# Patient Record
Sex: Female | Born: 1935 | Race: White | Hispanic: No | Smoking: Former smoker
Health system: Southern US, Community
[De-identification: ages and names within clinical notes are randomized; demographics above are authoritative.]

## PROBLEM LIST (undated history)

## (undated) DIAGNOSIS — I639 Cerebral infarction, unspecified: Secondary | ICD-10-CM

## (undated) DIAGNOSIS — C50912 Malignant neoplasm of unspecified site of left female breast: Secondary | ICD-10-CM

## (undated) DIAGNOSIS — I6521 Occlusion and stenosis of right carotid artery: Secondary | ICD-10-CM

## (undated) DIAGNOSIS — Z9289 Personal history of other medical treatment: Secondary | ICD-10-CM

## (undated) DIAGNOSIS — E7800 Pure hypercholesterolemia, unspecified: Secondary | ICD-10-CM

## (undated) DIAGNOSIS — J189 Pneumonia, unspecified organism: Secondary | ICD-10-CM

## (undated) DIAGNOSIS — G459 Transient cerebral ischemic attack, unspecified: Secondary | ICD-10-CM

## (undated) DIAGNOSIS — I10 Essential (primary) hypertension: Secondary | ICD-10-CM

## (undated) DIAGNOSIS — M1990 Unspecified osteoarthritis, unspecified site: Secondary | ICD-10-CM

## (undated) HISTORY — PX: JOINT REPLACEMENT: SHX530

## (undated) HISTORY — PX: BREAST BIOPSY: SHX20

## (undated) HISTORY — PX: TOENAIL EXCISION: SUR558

## (undated) HISTORY — PX: TOTAL SHOULDER REPLACEMENT: SUR1217

## (undated) HISTORY — DX: Cerebral infarction, unspecified: I63.9

## (undated) HISTORY — PX: OTHER SURGICAL HISTORY: SHX169

## (undated) HISTORY — DX: Essential (primary) hypertension: I10

## (undated) HISTORY — PX: CATARACT EXTRACTION W/ INTRAOCULAR LENS  IMPLANT, BILATERAL: SHX1307

## (undated) HISTORY — DX: Unspecified osteoarthritis, unspecified site: M19.90

## (undated) HISTORY — PX: DILATION AND CURETTAGE OF UTERUS: SHX78

---
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
## 1995-09-12 HISTORY — PX: THUMB FUSION: SUR636

## 1998-09-11 DIAGNOSIS — C50912 Malignant neoplasm of unspecified site of left female breast: Secondary | ICD-10-CM

## 1998-09-11 HISTORY — DX: Malignant neoplasm of unspecified site of left female breast: C50.912

## 1999-05-13 HISTORY — PX: MASTECTOMY, PARTIAL: SHX709

## 2002-03-17 ENCOUNTER — Encounter: Admission: RE | Admit: 2002-03-17 | Discharge: 2002-03-17 | Payer: Self-pay | Admitting: General Surgery

## 2002-03-17 ENCOUNTER — Encounter: Payer: Self-pay | Admitting: General Surgery

## 2003-03-23 ENCOUNTER — Encounter: Admission: RE | Admit: 2003-03-23 | Discharge: 2003-03-23 | Payer: Self-pay | Admitting: General Surgery

## 2003-03-23 ENCOUNTER — Encounter: Payer: Self-pay | Admitting: General Surgery

## 2004-03-25 ENCOUNTER — Encounter: Admission: RE | Admit: 2004-03-25 | Discharge: 2004-03-25 | Payer: Self-pay | Admitting: General Surgery

## 2005-04-03 ENCOUNTER — Encounter: Admission: RE | Admit: 2005-04-03 | Discharge: 2005-04-03 | Payer: Self-pay | Admitting: General Surgery

## 2005-04-13 ENCOUNTER — Encounter: Admission: RE | Admit: 2005-04-13 | Discharge: 2005-04-13 | Payer: Self-pay | Admitting: General Surgery

## 2006-04-16 ENCOUNTER — Encounter: Admission: RE | Admit: 2006-04-16 | Discharge: 2006-04-16 | Payer: Self-pay | Admitting: General Surgery

## 2006-10-01 ENCOUNTER — Encounter: Admission: RE | Admit: 2006-10-01 | Discharge: 2006-10-01 | Payer: Self-pay | Admitting: Internal Medicine

## 2007-01-10 ENCOUNTER — Ambulatory Visit: Payer: Self-pay | Admitting: *Deleted

## 2007-01-24 ENCOUNTER — Encounter: Admission: RE | Admit: 2007-01-24 | Discharge: 2007-01-24 | Payer: Self-pay | Admitting: Orthopedic Surgery

## 2007-02-22 ENCOUNTER — Inpatient Hospital Stay (HOSPITAL_COMMUNITY): Admission: RE | Admit: 2007-02-22 | Discharge: 2007-02-24 | Payer: Self-pay | Admitting: Orthopedic Surgery

## 2007-05-24 ENCOUNTER — Encounter: Admission: RE | Admit: 2007-05-24 | Discharge: 2007-05-24 | Payer: Self-pay | Admitting: Internal Medicine

## 2008-01-03 ENCOUNTER — Ambulatory Visit: Payer: Self-pay | Admitting: Vascular Surgery

## 2008-03-26 ENCOUNTER — Encounter: Admission: RE | Admit: 2008-03-26 | Discharge: 2008-03-26 | Payer: Self-pay | Admitting: Orthopedic Surgery

## 2008-03-30 ENCOUNTER — Observation Stay (HOSPITAL_COMMUNITY): Admission: RE | Admit: 2008-03-30 | Discharge: 2008-03-31 | Payer: Self-pay | Admitting: Orthopedic Surgery

## 2008-06-16 ENCOUNTER — Encounter: Admission: RE | Admit: 2008-06-16 | Discharge: 2008-06-16 | Payer: Self-pay | Admitting: General Surgery

## 2009-04-05 ENCOUNTER — Ambulatory Visit: Payer: Self-pay | Admitting: Vascular Surgery

## 2009-06-04 IMAGING — CR DG SHOULDER 1V*L*
1 series · 1 of 1 positions shown · non-contrast
Comparison: 10/01/2006

CLINICAL DATA: Is post left shoulder arthroplasty

PORTABLE LEFT SHOULDER - 2+ VIEW

[view not recorded]
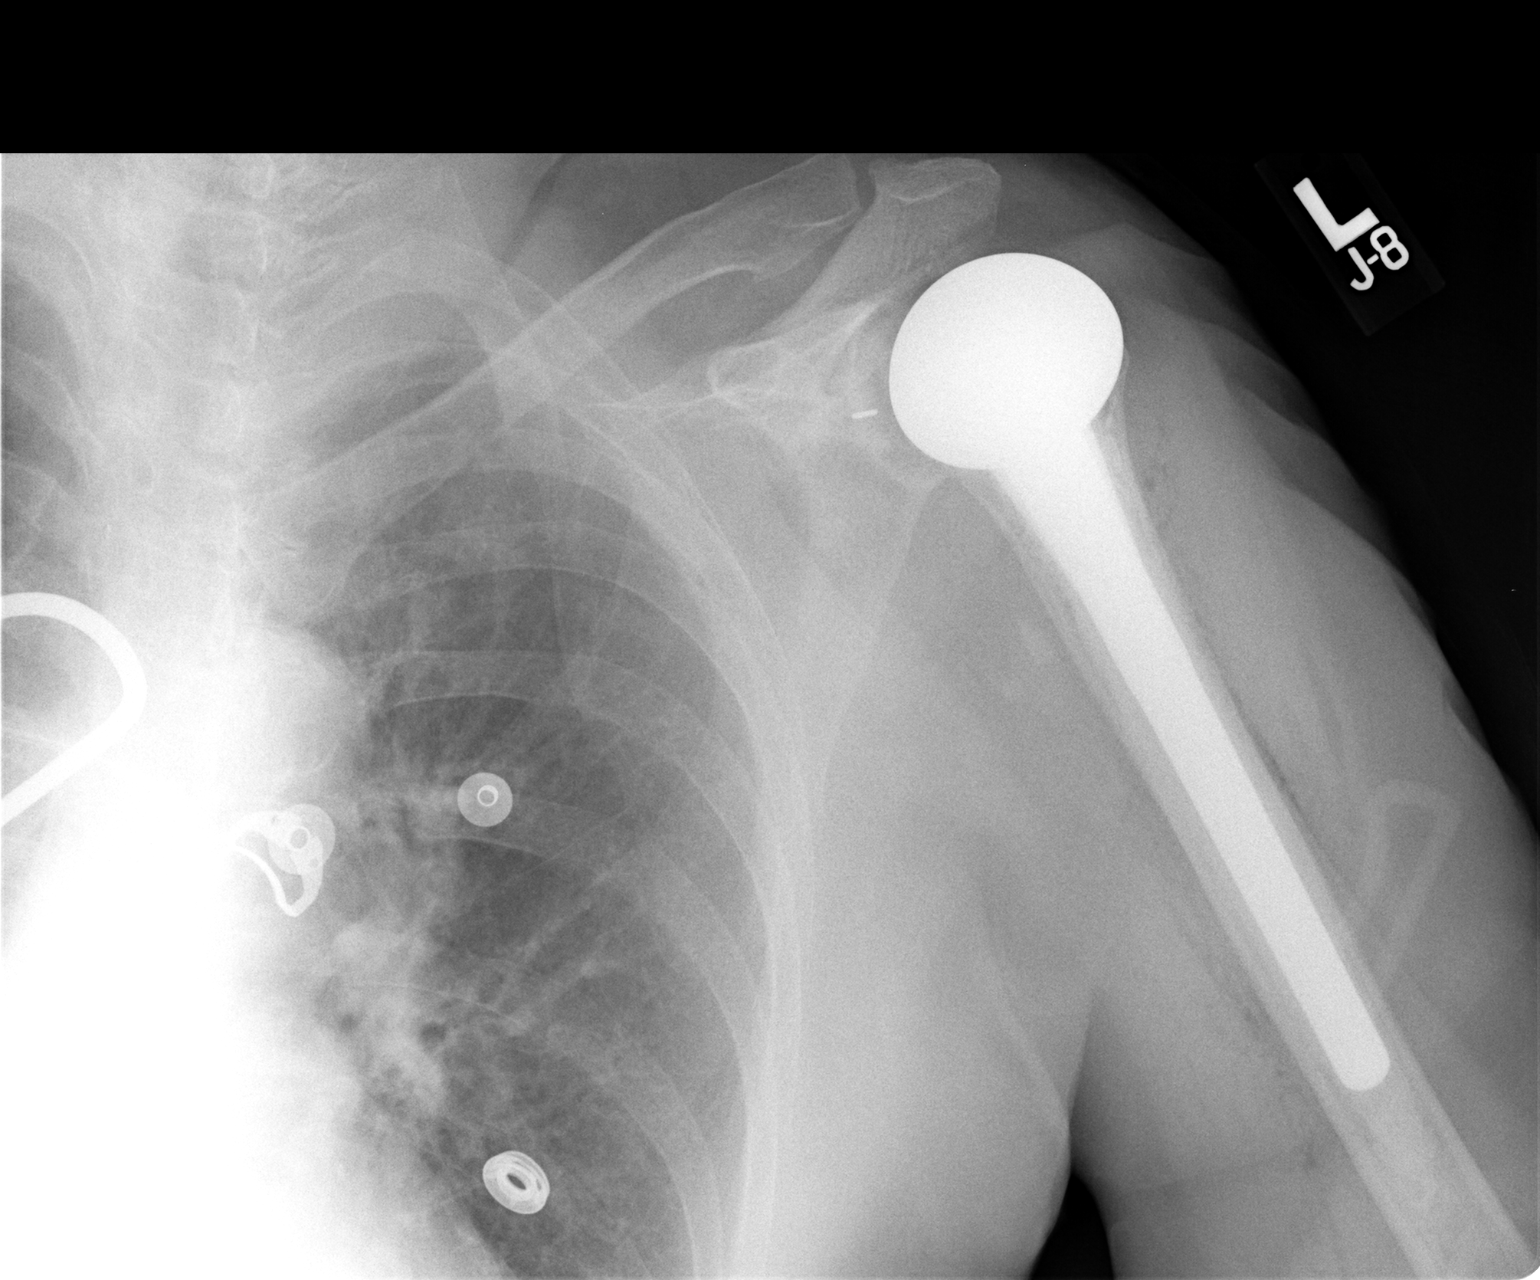

[1 of 1 positions shown; findings below may reference images not displayed]

FINDINGS: Status post left shoulder hemiarthroplasty with a
prosthetic left humeral head.  Good position and alignment in the
AP projection.
IMPRESSION: Good position alignment in the AP projection, following left
shoulder hemiarthroplasty.

## 2009-06-22 ENCOUNTER — Encounter: Admission: RE | Admit: 2009-06-22 | Discharge: 2009-06-22 | Payer: Self-pay | Admitting: General Surgery

## 2009-07-19 ENCOUNTER — Inpatient Hospital Stay (HOSPITAL_COMMUNITY): Admission: RE | Admit: 2009-07-19 | Discharge: 2009-07-22 | Payer: Self-pay | Admitting: Orthopedic Surgery

## 2009-07-19 HISTORY — PX: TOTAL KNEE ARTHROPLASTY: SHX125

## 2009-11-15 HISTORY — PX: EYE SURGERY: SHX253

## 2010-01-28 ENCOUNTER — Ambulatory Visit: Payer: Self-pay | Admitting: Vascular Surgery

## 2010-02-09 HISTORY — PX: TOTAL KNEE ARTHROPLASTY: SHX125

## 2010-02-21 ENCOUNTER — Inpatient Hospital Stay (HOSPITAL_COMMUNITY): Admission: RE | Admit: 2010-02-21 | Discharge: 2010-02-24 | Payer: Self-pay | Admitting: Orthopedic Surgery

## 2010-06-23 ENCOUNTER — Encounter: Admission: RE | Admit: 2010-06-23 | Discharge: 2010-06-23 | Payer: Self-pay | Admitting: General Surgery

## 2010-11-27 LAB — CBC
HCT: 29.1 % — ABNORMAL LOW (ref 36.0–46.0)
Hemoglobin: 10.1 g/dL — ABNORMAL LOW (ref 12.0–15.0)
MCHC: 32.9 g/dL (ref 30.0–36.0)
MCHC: 34.7 g/dL (ref 30.0–36.0)
MCV: 87.3 fL (ref 78.0–100.0)
RBC: 3.33 MIL/uL — ABNORMAL LOW (ref 3.87–5.11)
RDW: 12.7 % (ref 11.5–15.5)
RDW: 13.4 % (ref 11.5–15.5)
WBC: 6.9 10*3/uL (ref 4.0–10.5)
WBC: 8.2 10*3/uL (ref 4.0–10.5)

## 2010-11-27 LAB — BASIC METABOLIC PANEL
BUN: 14 mg/dL (ref 6–23)
CO2: 28 mEq/L (ref 19–32)
Calcium: 7.9 mg/dL — ABNORMAL LOW (ref 8.4–10.5)
Calcium: 8.1 mg/dL — ABNORMAL LOW (ref 8.4–10.5)
Chloride: 101 mEq/L (ref 96–112)
Creatinine, Ser: 0.56 mg/dL (ref 0.4–1.2)
GFR calc Af Amer: >60 mL/min (ref >60)
Sodium: 134 mEq/L — ABNORMAL LOW (ref 135–145)

## 2010-11-27 LAB — PROTIME-INR: Prothrombin Time: 20.5 seconds — ABNORMAL HIGH (ref 11.6–15.2)

## 2010-11-28 LAB — URINALYSIS, ROUTINE W REFLEX MICROSCOPIC
Bilirubin Urine: NEGATIVE
Hgb urine dipstick: NEGATIVE
Ketones, ur: NEGATIVE mg/dL
Protein, ur: NEGATIVE mg/dL
pH: 6.5 (ref 5.0–8.0)

## 2010-11-28 LAB — SURGICAL PCR SCREEN
MRSA, PCR: NEGATIVE
Staphylococcus aureus: NEGATIVE

## 2010-11-28 LAB — CBC
HCT: 40.5 % (ref 36.0–46.0)
MCHC: 33.7 g/dL (ref 30.0–36.0)
MCHC: 34.1 g/dL (ref 30.0–36.0)
MCV: 89.2 fL (ref 78.0–100.0)
MCV: 90.5 fL (ref 78.0–100.0)
Platelets: 155 10*3/uL (ref 150–400)
RBC: 3.09 MIL/uL — ABNORMAL LOW (ref 3.87–5.11)
RDW: 13.9 % (ref 11.5–15.5)
WBC: 4.6 10*3/uL (ref 4.0–10.5)
WBC: 9.4 10*3/uL (ref 4.0–10.5)

## 2010-11-28 LAB — TYPE AND SCREEN
ABO/RH(D): O POS
Antibody Screen: NEGATIVE

## 2010-11-28 LAB — COMPREHENSIVE METABOLIC PANEL
Chloride: 102 mEq/L (ref 96–112)
GFR calc Af Amer: >60 mL/min (ref >60)
Sodium: 138 mEq/L (ref 135–145)
Total Protein: 6.9 g/dL (ref 6.0–8.3)

## 2010-11-28 LAB — PROTIME-INR
INR: 0.94 (ref 0.00–1.49)
INR: 1.12 (ref 0.00–1.49)
Prothrombin Time: 14.3 seconds (ref 11.6–15.2)

## 2010-11-28 LAB — BASIC METABOLIC PANEL
BUN: 13 mg/dL (ref 6–23)
Calcium: 7.9 mg/dL — ABNORMAL LOW (ref 8.4–10.5)
Creatinine, Ser: 0.5 mg/dL (ref 0.4–1.2)
GFR calc Af Amer: >60 mL/min (ref >60)

## 2010-12-14 LAB — TYPE AND SCREEN
ABO/RH(D): O POS
Antibody Screen: NEGATIVE

## 2010-12-14 LAB — PROTIME-INR
INR: 0.97 (ref 0.00–1.49)
Prothrombin Time: 12.8 seconds (ref 11.6–15.2)
Prothrombin Time: 15.1 seconds (ref 11.6–15.2)
Prothrombin Time: 16.7 seconds — ABNORMAL HIGH (ref 11.6–15.2)

## 2010-12-14 LAB — BASIC METABOLIC PANEL
BUN: 18 mg/dL (ref 6–23)
Calcium: 7.9 mg/dL — ABNORMAL LOW (ref 8.4–10.5)
Creatinine, Ser: 0.51 mg/dL (ref 0.4–1.2)
GFR calc non Af Amer: >60 mL/min (ref >60)
Glucose, Bld: 158 mg/dL — ABNORMAL HIGH (ref 70–99)
Potassium: 3.9 mEq/L (ref 3.5–5.1)

## 2010-12-14 LAB — CBC
HCT: 25.4 % — ABNORMAL LOW (ref 36.0–46.0)
Hemoglobin: 10.9 g/dL — ABNORMAL LOW (ref 12.0–15.0)
MCHC: 34 g/dL (ref 30.0–36.0)
MCV: 90.2 fL (ref 78.0–100.0)
Platelets: 119 10*3/uL — ABNORMAL LOW (ref 150–400)
RBC: 3.37 MIL/uL — ABNORMAL LOW (ref 3.87–5.11)
RBC: 3.54 MIL/uL — ABNORMAL LOW (ref 3.87–5.11)
RDW: 13.4 % (ref 11.5–15.5)
RDW: 14.3 % (ref 11.5–15.5)
WBC: 4.7 10*3/uL (ref 4.0–10.5)
WBC: 4.9 10*3/uL (ref 4.0–10.5)

## 2010-12-14 LAB — COMPREHENSIVE METABOLIC PANEL
ALT: 30 U/L (ref 0–35)
AST: 40 U/L — ABNORMAL HIGH (ref 0–37)
Albumin: 3.6 g/dL (ref 3.5–5.2)
Alkaline Phosphatase: 86 U/L (ref 39–117)
BUN: 19 mg/dL (ref 6–23)
CO2: 27 mEq/L (ref 19–32)
CO2: 28 mEq/L (ref 19–32)
Calcium: 7.8 mg/dL — ABNORMAL LOW (ref 8.4–10.5)
Calcium: 8.7 mg/dL (ref 8.4–10.5)
Chloride: 101 mEq/L (ref 96–112)
Creatinine, Ser: 0.46 mg/dL (ref 0.4–1.2)
GFR calc Af Amer: >60 mL/min (ref >60)
GFR calc Af Amer: >60 mL/min (ref >60)
GFR calc non Af Amer: >60 mL/min (ref >60)
GFR calc non Af Amer: >60 mL/min (ref >60)
Glucose, Bld: 109 mg/dL — ABNORMAL HIGH (ref 70–99)
Glucose, Bld: 80 mg/dL (ref 70–99)
Potassium: 3.7 mEq/L (ref 3.5–5.1)
Potassium: 4.9 mEq/L (ref 3.5–5.1)
Sodium: 135 mEq/L (ref 135–145)
Sodium: 135 mEq/L (ref 135–145)
Total Bilirubin: 0.4 mg/dL (ref 0.3–1.2)
Total Protein: 6.3 g/dL (ref 6.0–8.3)

## 2010-12-14 LAB — CROSSMATCH

## 2010-12-14 LAB — PREPARE RBC (CROSSMATCH)

## 2010-12-14 LAB — ABO/RH: ABO/RH(D): O POS

## 2011-01-24 NOTE — Discharge Summary (Signed)
[name], [name]             ACCOUNT NO.:  [id number]   MEDICAL RECORD NO.:  [id number]          PATIENT TYPE:  INP   LOCATION:  5011                         FACILITY:  [facility]   PHYSICIAN:  Almedia Balls. Ranell Patrick, M.D. DATE OF BIRTH:  [birth year]   DATE OF ADMISSION:  03/30/2008  DATE OF DISCHARGE:  03/31/2008                               DISCHARGE SUMMARY   ADMISSION DIAGNOSIS:  Left shoulder pain secondary to end-stage  osteoarthritis.   DISCHARGE DIAGNOSIS:  Left shoulder pain secondary to end-stage  osteoarthritis, status post total shoulder arthroplasty.   BRIEF HISTORY:  The patient 77 year old female with worsening left  shoulder pain secondary to osteoarthritis.  The patient has elected to  have a left total shoulder arthroplasty by Dr. Malon Kindle.   PROCEDURE:  The patient had a left total shoulder arthroplasty by Dr.  Malon Kindle on March 30, 2008.  Assistant was [employer], P.A.-C.  General anesthesia was used with no complications.   HOSPITAL COURSE:  The patient admitted on March 30, 2008 for the above-  stated procedure which she tolerated well.  After adequate time in the  post-anesthesia care unit, she was transferred up to 5000.  On  postoperative day #1, the patient complained about minimal pain in left  shoulder, was actually rather happy pain was so less.  Neurovascularly,  she was intact.  Her dressing was intact with no signs of drainage.  Sling was intact.   DISCHARGE/PLAN:  The patient will be discharged home on March 31, 1008.  Her condition is stable.  Her diet is regular.  THE PATIENT HAS ALLERGY  TO VICODIN AND PERCOCET.   DISCHARGE MEDICATIONS:  1. Tenormin 25 mg p.o. every night.  2. Celebrex 200 mg p.o. b.i.d.  3. Lasix 20 mg p.o. daily.  4. Robaxin 500 mg p.o. q.6h.  5. Paroxetine 25 mg p.o. daily.  6. Darvocet 1-2 tablets q.4-6h. p.r.n. pain.   FOLLOW-UP:  Patient will follow back up with Dr. Malon Kindle in 1 to 2  weeks.     Thomas B. Durwin Nora, P.A.      Almedia Balls. Ranell Patrick, M.D.  Electronically Signed   TBD/MEDQ  D:  03/31/2008  T:  03/31/2008  Job:  [id number]

## 2011-01-24 NOTE — H&P (Signed)
[name], [name]             ACCOUNT NO.:  [id number]   MEDICAL RECORD NO.:  [id number]          PATIENT TYPE:  INP   LOCATION:  NA                           FACILITY:  [facility]   PHYSICIAN:  Almedia Balls. Ranell Patrick, M.D. DATE OF BIRTH:  06-19-1931   DATE OF ADMISSION:  DATE OF DISCHARGE:                              HISTORY & PHYSICAL   CHIEF COMPLAINT:  Right shoulder pain.   HISTORY OF PRESENT ILLNESS:  The patient is a 77 year old healthy-  appearing female complaining about worsening right shoulder pain.  The  patient is elected to have a right total shoulder arthroplasty by Dr.  Malon Kindle due to refractory symptoms with conservative treatment.   PAST MEDICAL HISTORY:  1. Hypertension.  2. Osteoarthritis.   The patient has no known allergies.   CURRENT MEDICATIONS:  1. Hydrochlorothiazide 12.5 mg p.o. daily.  2. Diltiazem 180/24 mg p.o. daily once.  3. Celebrex 200 mg 1 tab p.o. daily.   SOCIAL HISTORY:  The patient of Dr. Timothy Lasso.  She is married and denies  alcohol or smoking use.   REVIEW OF SYSTEMS:  Negative except for decreased range of motion and  pain with her right upper extremity and her shoulder.   PHYSICAL EXAM:  VITAL SIGNS:  Pulse 80, respirations 18, blood pressure  152/78.  The patient is a healthy-appearing 77 year old female in no  acute distress.  GENERAL:  Pleasant mood and affect, alert and oriented x3.  EXAMINATION OF THE HEAD AND NECK:  Shows no tenderness to palpation of  paraspinal muscles, spinous processes of the spinous processes of the  cervical spine.  She has full range of motion without any difficulty,  and cranial nerves II-XII are grossly intact.  EXAMINATION OF THE RIGHT SHOULDER:  Shows forward flexion to about 100  degrees, external rotation to 45 degrees, and internal rotation to L5.  Strength of internal and external rotation is 4.5/5. Capillary refill is  less than 2 seconds.  She does have mild crepitus with range of motion.  The left lower extremity shows full range of motion with sensation  grossly intact.  No gross deformity.  EXAMINATION OF THE CHEST:  Shows active breath sounds bilaterally.  No  wheezes, rhonchi, or rales.  HEART:  Shows Regular rate and rhythm, no murmur.  ABDOMEN:  Nontender, nondistended.  SKIN:  She has no rashes noted on exam.  EXTREMITIES:  No pedal edema.  She does have a normal gait.   X-rays show endstage osteoarthritis of the right shoulder.   IMPRESSION:  End stage osteoarthritis of the right shoulder.   PLAN:  She is to have a right total shoulder arthroplasty by Dr. Malon Kindle.      Thomas B. Durwin Nora, P.A.      Almedia Balls. Ranell Patrick, M.D.  Electronically Signed    TBD/MEDQ  D:  02/14/2007  T:  02/14/2007  Job:  [id number]

## 2011-01-24 NOTE — Op Note (Signed)
NAMENAILANI, FULL             ACCOUNT NO.:  [id number]   MEDICAL RECORD NO.:  [id number]          PATIENT TYPE:  INP   LOCATION:  5011                         FACILITY:  [facility]   PHYSICIAN:  Almedia Balls. Ranell Patrick, M.D. DATE OF BIRTH:  March 09, 1931   DATE OF PROCEDURE:  03/30/2008  DATE OF DISCHARGE:  03/31/2008                               OPERATIVE REPORT   PREOPERATIVE DIAGNOSES:  Left shoulder end-stage osteoarthritis.   POSTOPERATIVE DIAGNOSIS:  Left shoulder end-stage osteoarthritis.   PROCEDURE:  Left total shoulder replacement.   SURGEON:  Almedia Balls. Ranell Patrick, MD.   ASSISTANT:  Donnie Coffin. Dixon, PA   ANESTHESIA:  General anesthesia was used, plus interscalene block.   ESTIMATED BLOOD LOSS:  Less than was 100 mL.   FLUID REPLACEMENT:  1200 mL crystalloid.   INSTRUMENT COUNT:  Correct.   COMPLICATIONS:  None.   Preoperative antibiotics were given.   INDICATIONS:  The patient is a 77 year old female with a history of  worsening shoulder arthritis in the left.  She has had prior right total  shoulder replacement 4 months ago and has done well with that.  She  desires left shoulder replacement to restore function without any pain.  Informed consent was obtained.   DESCRIPTION OF PROCEDURE:  After an adequate level of anesthesia was  achieved,  the patient was positioned supine on the operating table.  She was brought up into the modified beach chair position.  The left  shoulder was sterilely prepped and draped in usual manner.  A  deltopectoral approach was utilized starting at the coracoid and  extending down to the anterior humeral shaft.  Dissection was carried  sharply down through the subcutaneous tissue.  Cephalic vein taken  lateral with deltoid.  Pectoralis taken medially.  The upper centimeter  pectoralis released.  The conjoined tendon taken medially.  Subscapularis taken off of less tuberosity and free depth from the  surrounding soft tissues.  Two #2 FiberWire  sutures placed in a modified  Mason-Allen suture technique into the free end of the subscap tendon.  The caps were released off the humerus.  The humeral osteotomy performed  using a neck-cut guide with the arm elbow at the side and about 10  degrees of external rotation giving [place] 20 degrees of retroverted cut.  We cut right at the insertion of the rotator cuff.  At this point,  removed all osteophytes off the humerus.  We then prepared the humerus  with sequential reaming between 12 and 14, thus we selected a 12 stem  and broached that and then we were planning on cementing the 12 stem.  We then retracted the humerus posteriorly and placed retractors around  the glenoid such that we could see at 360 degrees.  We marked the 12  o'clock, 6 o'clock, 3 and 9 o'clock positions to identify the center of  the glenoid.  We then went ahead and placed a centering guide and  drilled a hole for a 40 keeled glenoid.  We next went ahead and reamed  down to bleeding bone and then performed 12 o'clock and 6 o'clock  drill  holes, used a bur to take out remaining bone and then a keel punch to  make the hole for keel.  We trialed the 40 keeled glenoid component and  then went ahead and placed a 44 x 18 eccentric humeral head spinning the  eccentric portion towards the rotator cuff.  We had good coverage of the  proximal humerus.  Reduced the shoulder and felt like we had good soft  tissue balancing at this point.  Removed all trial components.  We  cemented the humeral component in first using the P #1 cement.  Once  that was allowed to harden, then we went ahead and checked for any  additional cement and we found none, and then cemented after using  Gelfoam soaked-thrombin to dry out the glenoid.  Cemented the glenoid  component into place.  Once that cement was allowed to harden, we  trialed again with the 18 and 21.  The 21 had a little bit better soft  tissue stability.  She was a little too loose with  the 18 and thus  replaced the 44 x 21 eccentric humeral head component on and impacted  that in place again with appropriate coverage, and then reduced the  shoulder, we were happy with her range of motion and balance and then  went and repaired her subscap with multiple number #2 FiberWire sutures.  We had placed some sutures in the shaft prior to placing her stem and  also used Mason-Allen sutures through good soft tissue including the  biceps out laterally.  We had a nice soft tissue repair including  oversewing the rotator interval with #2 FiberWire and a figure-of-eight,  and then went ahead and closed the deltopectoral interval after thorough  irrigation with 0 Vicryl suture followed by 2-0 Vicryl subcutaneous  closure and 4-0 Monocryl for skin.  Steri-Strips were applied followed  by a sterile dressing. The patient tolerated the surgery well.      Almedia Balls. Ranell Patrick, M.D.  Electronically Signed     SRN/MEDQ  D:  03/30/2008  T:  03/31/2008  Job:  [id number]

## 2011-01-24 NOTE — Procedures (Signed)
CAROTID DUPLEX EXAM   INDICATION:  Followup carotid artery disease.   HISTORY:  Diabetes:  No.  Cardiac:  No.  Hypertension:  Yes.  Smoking:  Quit in 1981.  Previous Surgery:  No.  CV History:  Amaurosis Fugax No, Paresthesias No, Hemiparesis No                                       RIGHT             LEFT  Brachial systolic pressure:         110  Brachial Doppler waveforms:         Biphasic  Vertebral direction of flow:        Antegrade         Antegrade  DUPLEX VELOCITIES (cm/sec)  CCA peak systolic                   88                74  ECA peak systolic                   110               64  ICA peak systolic                   147               82  ICA end diastolic                   43                26  PLAQUE MORPHOLOGY:                  Calcified         Mixed  PLAQUE AMOUNT:                      Moderate          Mild  PLAQUE LOCATION:                    ICA, ECA          ICA   IMPRESSION:  1. 40-59% right ICA stenosis by new criteria.  2. 20-39% left ICA stenosis.  3. Study essentially unchanged from 01/10/2007.  4. A preliminary copy was faxed to Dr. Ferd Hibbs office.   ___________________________________________  Larina Earthly, M.D.   DP/MEDQ  D:  01/03/2008  T:  01/03/2008  Job:  [id number]

## 2011-01-24 NOTE — H&P (Signed)
[name], [name]             ACCOUNT NO.:  [id number]   MEDICAL RECORD NO.:  [id number]          PATIENT TYPE:  INP   LOCATION:  NA                           FACILITY:  [facility]   PHYSICIAN:  Almedia Balls. Ranell Patrick, M.D. DATE OF BIRTH:  August 05, 1931   DATE OF ADMISSION:  DATE OF DISCHARGE:                              HISTORY & PHYSICAL   CHIEF COMPLAINT:  Left shoulder pain.   HISTORY OF PRESENT ILLNESS:  A 77 year old female with worsening left  shoulder pain refractory to conservative treatment.  The patient has  elected to have a left total shoulder arthroplasty by Dr. Malon Kindle.   PAST MEDICAL HISTORY:  Hypertension and osteoarthritis.   FAMILY HISTORY:  Coronary artery disease.   SOCIAL HISTORY:  Patient of Dr. Timothy Lasso.  Does not smoke or use alcohol.   ALLERGIES:  None.   CURRENT MEDICATIONS:  Celebrex 200 mg p.o. daily, Tylenol Arthritis q.4-  6 h. p.r.n. pain, atenolol 25 mg p.o. daily, and Lasix 20 mg p.o. daily.   REVIEW OF SYSTEMS:  Pain with range of motion in the left shoulder.  She  does have a previous right shoulder hemiarthroplasty with good range of  motion.   PHYSICAL EXAMINATION:  VITAL SIGNS:  Pulse 74, respirations 18, and  blood pressure 138/78.  GENERAL:  The patient is a healthy-appearing 77 year old female, in no  acute distress, pleasant, alert and oriented x3.  HEAD AND NECK:  Full range of motion.  Cranial nerves II-XII grossly  intact.  CHEST:  She has active breath sounds bilaterally.  No wheezes, rhonchi,  or rales.  HEART:  Regular rate and rhythm without murmur.  ABDOMEN:  Nontender and nondistended with active bowel sounds.  EXTREMITIES:  Moderate tenderness to the left shoulder with range of  motion and activity.  NEUROLOGICALLY:  She is intact with no rashes or edema in bilateral,  upper, and lower extremity.   X-rays show end-stage osteoarthritis to the left shoulder.   IMPRESSION:  End-stage osteoarthritis, left shoulder.   PLAN  OF ACTION:  To have a left total shoulder arthroplasty by Dr.  Malon Kindle.      Thomas B. Durwin Nora, P.A.      Almedia Balls. Ranell Patrick, M.D.  Electronically Signed    TBD/MEDQ  D:  03/25/2008  T:  03/26/2008  Job:  [id number]

## 2011-01-24 NOTE — Op Note (Signed)
[name], [name]             ACCOUNT NO.:  [id number]   MEDICAL RECORD NO.:  [id number]          PATIENT TYPE:  INP   LOCATION:  5023                         FACILITY:  [facility]   PHYSICIAN:  Almedia Balls. Ranell Patrick, M.D. DATE OF BIRTH:  11/27/1930   DATE OF PROCEDURE:  02/22/2007  DATE OF DISCHARGE:                               OPERATIVE REPORT   PREOPERATIVE DIAGNOSIS:  Right shoulder osteoarthritis, end stage.   POSTOPERATIVE DIAGNOSIS:  Right shoulder osteoarthritis, end stage.   PROCEDURE PERFORMED:  Right total shoulder replacement using DePuy  [employer] System with a keeled glenoid.   ATTENDING SURGEON:  Almedia Balls. Ranell Patrick, M.D.   ASSISTANT:  Donnie Coffin. Durwin Nora, P.A.   ANESTHESIA:  General anesthesia plus interscalene block anesthesia was  used.   ESTIMATED BLOOD LOSS:  Was 100 mL.   FLUID REPLACEMENT:  Was 250 mL albumin, 1500 mL crystalloid.   URINE OUTPUT:  550 mL.   INSTRUMENT COUNTS:  Correct.   COMPLICATIONS:  No complications.   ANTIBIOTICS:  Perioperative antibiotics were given.   INDICATIONS:  The patient is a 77 year old female with worsening  bilateral shoulder pain secondary to end-stage osteoarthritis.  Radiographs indicating bone-on-bone arthritis.  Patient continues to  have worsening pain despite conservative measures, including injections,  anti-inflammatories and therapy.  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  Patient presents now for operative  treatment, desiring a total shoulder arthroplasty to relieve pain and  restore function.  Informed consent was obtained.   DESCRIPTION OF PROCEDURE:  After an adequate level of anesthesia was  achieved, the patient was positioned in the modified beach chair  position.  All neurovascular structures were padded appropriately.  The  right shoulder was sterilely prepped and draped in the usual manner,  preoperative range of motion indicating forward elevation to about 140,  abduction  to 90, external rotation is about 60, internal rotation to her  posterior belt line.   After completion of the exam under anesthesia and a sterile prep and  drape, we went ahead and entered the shoulder using a deltopectoral  approach.  We started at the coracoid process, extending down to the  anterior humerus.  Dissection was carried sharply down through  subcutaneous tissues.  The cephalic vein was identified and taken  laterally over the deltoid.  The conjoined tendon was identified and  taken medially, exposing the subscapularis.  There was a large cystic  mass anterior to the biceps tendon.  This was removed sharply using  Bovie electrocautery.  This was intermittently involved with the biceps  and required sacrifice of the biceps tendon.  We tagged that for later  tenodesis.  The cystic structure was removed in its entirety.  It  appeared benign to me and reactive, such as related to osteoarthritis.  We did not send that to pathology due to its benign appearance and it  was filled with normal serous fluid.  This measured approximately 2 x 2  cm.   The subscapularis was divided about 1 cm medial to its insertion on the  left tuberosity.  We removed the anterior inferior  capsule.  We freed up  the subscapularis off the anterior scapular neck and also off the  coracoid process so we had a nice bounce.  We placed two #2 FiberWires  in a modified Mason-Allen suture technique into the free end of the  tendon for repair later.  Next, we went ahead and made our humeral cut  with the elbow at the patient's side and the forearm externally rotated  approximately 20 degrees.  We made an anterior to posterior cut right on  the articular margin with the rotator cuff anterior to posterior with an  oscillating saw.  We had a nice cut, sized the humeral head to a size  44.  We next removed osteophytes off the humeral neck and then went  ahead and prepared our humerus with sequential reaming up to  a size 14  and then broaching up to a 14 stem.  After we had our trial stem in  place, we went ahead and posteriorly subluxed the humerus and then  performed a 360-degree exposure on the glenoid.  There was a large  posterior osteophyte which was removed revealing a very, very small  glenoid.  This sized to be a size 40.  We went ahead and placed our  central hole to ream and drill that right dead center in the glenoid  with the surgeon's finger on the scapular neck to get angle of the  drilling.   Once we had this pilot hole drilled, we went ahead and reamed down to  cancellous bone.  We had gotten through the calcified cartilage and had  some bleeding in all quadrants.  Once we did this, we went ahead and  placed the anchor peg glenoid drill guide in place and noted that the  two inferior peg holes were dangerously close to the inferior cortical  border of the scapula and the glenoid and felt that these would both  perforate the inferior glenoid and likely compromise the bony support  for the glenoid component.  Thus we went ahead and fell back on a keeled  design and utilizing a 3 mm bur created a channel for the keel and then  punched with the keel punch, preparing for the glenoid component.  We  thoroughly irrigated the wound, placed our trial implants and the 40  keeled glenoid followed by a 44 x 18 head on top of our 14 stem and then  reduced the shoulder.  We had a nice tension with the ability to  translate posteriorly approximately 50% of the glenoid, also to  translate inferiorly about 50% of the glenoid and excellent coverage.  We went ahead then and retrieved our trial implants, thoroughly  irrigated and then went ahead and cemented the glenoid into place,  this  was a 40 glenoid with a keel, and cemented with DePuy HV Smart Set  Cement to allow that to fully harden in 15 minutes before removing excess cement with a curved osteotome.   We next went ahead and removed our  trial stem and then thoroughly  suctioned and irrigated that canal.  We used available bone graft from  the humeral head and impaction grafted the 14 body into the humerus.  We  had drilled previously three holes in the lesser tuberosity area for  placement of two #2 FiberWire sutures with a total of four strands  coming out through the lesser tuberosity for the repair of the  subscapularis.  Then we impacted the stem into place.  We selected a 44  eccentric head x 18 which gave [place] excellent coverage of the upper  humerus and put the implant right adjacent of the rotator cuff which  appeared to be in good shape.   Once that was done, we obtained some x-rays to demonstrate that we had  excellent positioning of our implants, which we did, reduced the  shoulder, had a nice soft tissue balance and then repaired our  subscapularis to the lesser tuberosity using the FiberWire sutures, the  Mason-Allens medially and through-the-bone sutures laterally.  We  oversewed the upper portion of the subscapularis up to the rotator  interval area in the leading edge of the supraspinatus and had a nice  repair there at the very upper portion of the subscapularis with two #2  FiberWire  sutures in a figure-of-eight fashion.  Thoroughly irrigated the  shoulder, closed the deltopectoral interval with 0 Vicryl suture  followed by 2-0 Vicryl subcutaneous closure and 4-0 Monocryl for skin.  Steri-Strips were applied followed by a sterile dressing and a shoulder  sling immobilizer.  The patient tolerated the surgery well.      Almedia Balls. Ranell Patrick, M.D.  Electronically Signed     SRN/MEDQ  D:  02/22/2007  T:  02/23/2007  Job:  [id number]

## 2011-01-24 NOTE — Procedures (Signed)
CAROTID DUPLEX EXAM   INDICATION:  Follow up carotid artery disease.   HISTORY:  Diabetes:  No.  Cardiac:  No.  Hypertension:  Yes.  Smoking:  Previous.  Previous Surgery:  No.  CV History:  No.  Amaurosis Fugax No, Paresthesias No, Hemiparesis No                                       RIGHT             LEFT  Brachial systolic pressure:                           Mastectomy  Brachial Doppler waveforms:         SCA-WNL           SCA-WNL  Vertebral direction of flow:        Antegrade         Antegrade  DUPLEX VELOCITIES (cm/sec)  CCA peak systolic                   81                92  ECA peak systolic                   141               98  ICA peak systolic                   149               101  ICA end diastolic                   39                30  PLAQUE MORPHOLOGY:                  Mixed             Mixed  PLAQUE AMOUNT:                      Moderate          Mild  PLAQUE LOCATION:                    ICA/ECA           ICA   IMPRESSION:  1. Right ICA shows evidence of 40% to 59% stenosis.  2. Left ICA shows evidence of 20% to 39% stenosis.  3. No significant changes from previous study.   ___________________________________________  Quita Skye Hart [place], M.D.   AS/MEDQ  D:  04/05/2009  T:  04/05/2009  Job:  [id number]

## 2011-01-24 NOTE — Procedures (Signed)
CAROTID DUPLEX EXAM   INDICATION:  Follow up known carotid disease.   HISTORY:  Diabetes:  No.  Cardiac:  No.  Hypertension:  Yes.  Smoking:  Quit in 1980.  Previous Surgery:  No.  CV History:  Asymptomatic.  Amaurosis Fugax No, Paresthesias No, Hemiparesis No.                                       RIGHT             LEFT  Brachial systolic pressure:                           Mastectomy  Brachial Doppler waveforms:         Normal            Normal  Vertebral direction of flow:        Antegrade         Antegrade  DUPLEX VELOCITIES (cm/sec)  CCA peak systolic                   84                88  ECA peak systolic                   139               81  ICA peak systolic                   212               102  ICA end diastolic                   63                33  PLAQUE MORPHOLOGY:                  Mixed             Mixed  PLAQUE AMOUNT:                      Moderate-to-severe                  Mild  PLAQUE LOCATION:                    ICA, ECA          ICA   IMPRESSION:  1. Doppler velocities suggest a low-end of 60% to 79% stenosis in the      right internal carotid artery.  2. Doppler velocities suggest a 1% to 39% stenosis in the left      internal carotid artery.  3. Bilateral antegrade flow in vertebral arteries.   ___________________________________________  Di Kindle. Edilia Bo, M.D.   NT/MEDQ  D:  01/28/2010  T:  01/28/2010  Job:  [id number]

## 2011-01-24 NOTE — H&P (Signed)
NAMEHINDY, [name]             ACCOUNT NO.:  [id number]   MEDICAL RECORD NO.:  [id number]          PATIENT TYPE:  INP   LOCATION:  NA                           FACILITY:  [facility]   PHYSICIAN:  Maisie Fus B. Dixon, P.A.  DATE OF BIRTH:  03/03/1931   DATE OF ADMISSION:  DATE OF DISCHARGE:                              HISTORY & PHYSICAL   CHIEF COMPLAINT:  Left shoulder pain.   HISTORY OF PRESENT ILLNESS:  The patient is a 77 year old female with  worsening left shoulder pain, which has been refractory to conservative  treatment.  The patient has elected to have a left total shoulder  arthroplasty by Dr. Malon Kindle.   PAST MEDICAL HISTORY:  Hypertension and osteoarthritis.   FAMILY MEDICAL HISTORY:  Coronary artery disease.   SOCIAL HISTORY:  The patient is married.  She does not smoke or use  alcohol.   DRUG ALLERGIES:  None.   CURRENT MEDICATIONS:  Celebrex 200 mg p.o. daily.   Dictation ended at this point.      Thomas B. Ferne Coe.     TBD/MEDQ  D:  03/25/2008  T:  03/26/2008  Job:  [id number]

## 2011-01-27 NOTE — Discharge Summary (Signed)
[name], [name]             ACCOUNT NO.:  [id number]   MEDICAL RECORD NO.:  [id number]          PATIENT TYPE:  INP   LOCATION:  5023                         FACILITY:  [facility]   PHYSICIAN:  Almedia Balls. Ranell Patrick, M.D. DATE OF BIRTH:  January 03, 1931   DATE OF ADMISSION:  02/22/2007  DATE OF DISCHARGE:  02/24/2007                               DISCHARGE SUMMARY   ADMISSION DIAGNOSES:  1. Right shoulder pain secondary to osteoarthritis.  2. History of hypertension.   DISCHARGE DIAGNOSES:  1. Right shoulder pain, status post a right total shoulder      arthroplasty.  2. History of hypertension.   BRIEF HISTORY:  The patient is a 77 year old female complaining of  worsening pain in the right shoulder.  The patient has elected to have a  right total shoulder arthroplasty by Dr. Malon Kindle.   PROCEDURE:  The patient had a right total shoulder arthroplasty by Dr.  Malon Kindle on February 22, 2007.  Assistant was [employer]. Dixon, PA-C.  Estimated blood loss was 100 mL.  No complications.   HOSPITAL COURSE:  The patient was admitted on February 22 2007, for the  above-stated procedure.  After adequate time in the post anesthesia care  unit she was transferred up to 5000.  Postop day #1 the patient  complained about only minimal pain and was able to work with physical  therapy and states her range of motion was quite good.  Her labs were  within normal limits.  She had no numbness, tingling, or paresthesias.  Postop day #2 she had a well-healing incision, no signs of erythema or  infection.  Dressing was changed.  She did complete physical therapy and  occupational therapy and plans were made to be discharged home.   FOLLOW-UP:  The patient to follow up with Dr. Malon Kindle in 2 weeks.   DISCHARGE PLAN:  The patient will be discharged home on February 24, 2007.   DISCHARGE MEDICATIONS:  1. Robaxin 500 mg one tablet q.6h p.r.n. spasm.  2. Hydrochlorothiazide 12.5 mg p.o. daily.  3. Diltiazem  180/24 mg p.o. daily.  4. Celebrex 200 mg one tablet p.o. daily.  5. Percocet one to two tablets q.4-6h. p.r.n. pain, dose is 5/325 mg.   The patient has no known drug allergies.   CONDITION:  Stable upon discharge.      Thomas B. Durwin Nora, P.A.      Almedia Balls. Ranell Patrick, M.D.  Electronically Signed    TBD/MEDQ  D:  03/05/2007  T:  03/05/2007  Job:  [id number]

## 2011-02-14 ENCOUNTER — Encounter (INDEPENDENT_AMBULATORY_CARE_PROVIDER_SITE_OTHER): Payer: Self-pay | Admitting: General Surgery

## 2011-06-09 LAB — URINALYSIS, ROUTINE W REFLEX MICROSCOPIC
Glucose, UA: NEGATIVE
Nitrite: NEGATIVE
Protein, ur: NEGATIVE
pH: 6.5

## 2011-06-09 LAB — HEMOGLOBIN AND HEMATOCRIT, BLOOD
HCT: 30.6 — ABNORMAL LOW
Hemoglobin: 10.1 — ABNORMAL LOW

## 2011-06-09 LAB — BASIC METABOLIC PANEL
BUN: 11
CO2: 30
Calcium: 8.3 — ABNORMAL LOW
Calcium: 9.7
Chloride: 99
Creatinine, Ser: 0.6
GFR calc Af Amer: >60
GFR calc non Af Amer: >60
Glucose, Bld: 90
Sodium: 137

## 2011-06-09 LAB — CBC
Hemoglobin: 14.2
MCHC: 33.9
RDW: 13.5

## 2011-06-09 LAB — DIFFERENTIAL
Basophils Absolute: 0
Basophils Relative: 1
Eosinophils Relative: 2
Monocytes Absolute: 0.4
Neutro Abs: 4

## 2011-06-09 LAB — URINE MICROSCOPIC-ADD ON

## 2011-06-09 LAB — URINE CULTURE

## 2011-06-09 LAB — TYPE AND SCREEN
ABO/RH(D): O POS
Antibody Screen: NEGATIVE

## 2011-06-09 LAB — APTT: aPTT: 32

## 2011-06-13 ENCOUNTER — Other Ambulatory Visit (INDEPENDENT_AMBULATORY_CARE_PROVIDER_SITE_OTHER): Payer: Self-pay | Admitting: General Surgery

## 2011-06-13 DIAGNOSIS — Z1231 Encounter for screening mammogram for malignant neoplasm of breast: Secondary | ICD-10-CM

## 2011-06-28 LAB — BASIC METABOLIC PANEL
BUN: 13
Calcium: 8.2 — ABNORMAL LOW
Creatinine, Ser: 0.49
GFR calc non Af Amer: >60
Glucose, Bld: 122 — ABNORMAL HIGH

## 2011-06-29 ENCOUNTER — Ambulatory Visit
Admission: RE | Admit: 2011-06-29 | Discharge: 2011-06-29 | Disposition: A | Discharge status: Home / Self Care | Payer: Medicare Other | Source: Ambulatory Visit | Attending: General Surgery | Admitting: General Surgery

## 2011-06-29 DIAGNOSIS — Z1231 Encounter for screening mammogram for malignant neoplasm of breast: Secondary | ICD-10-CM

## 2011-06-29 LAB — CBC
HCT: 31.3 — ABNORMAL LOW
Hemoglobin: 10.7 — ABNORMAL LOW
MCHC: 34.2
MCV: 87.9
Platelets: 170
Platelets: 249
RDW: 13.5
RDW: 13.7
WBC: 6.5

## 2011-06-29 LAB — PROTIME-INR: INR: 0.9

## 2011-06-29 LAB — URINALYSIS, ROUTINE W REFLEX MICROSCOPIC
Bilirubin Urine: NEGATIVE
Hgb urine dipstick: NEGATIVE
Nitrite: NEGATIVE
Specific Gravity, Urine: 1.016 (ref 1.005–1.035)
Urobilinogen, UA: 0.2
pH: 6.5

## 2011-06-29 LAB — BASIC METABOLIC PANEL
BUN: 13
CO2: 28
Chloride: 97
Glucose, Bld: 104 — ABNORMAL HIGH
Potassium: 3.7
Sodium: 130 — ABNORMAL LOW

## 2011-06-29 LAB — COMPREHENSIVE METABOLIC PANEL
AST: 20
Albumin: 3.6
Alkaline Phosphatase: 78
BUN: 23
Chloride: 94 — ABNORMAL LOW
GFR calc Af Amer: >60
Potassium: 4
Total Bilirubin: 0.6
Total Protein: 6.7

## 2011-06-29 LAB — APTT: aPTT: 32

## 2011-06-29 LAB — URINE MICROSCOPIC-ADD ON
RBC / HPF: NONE SEEN
WBC, UA: NONE SEEN

## 2011-07-20 ENCOUNTER — Emergency Department (HOSPITAL_COMMUNITY): Payer: Medicare Other

## 2011-07-20 ENCOUNTER — Encounter (HOSPITAL_COMMUNITY): Payer: Self-pay | Admitting: Emergency Medicine

## 2011-07-20 ENCOUNTER — Emergency Department (HOSPITAL_COMMUNITY)
Admission: EM | Admit: 2011-07-20 | Discharge: 2011-07-20 | Disposition: A | Discharge status: Home / Self Care | Payer: Medicare Other | Attending: Emergency Medicine | Admitting: Emergency Medicine

## 2011-07-20 DIAGNOSIS — S0100XA Unspecified open wound of scalp, initial encounter: Secondary | ICD-10-CM | POA: Insufficient documentation

## 2011-07-20 DIAGNOSIS — W010XXA Fall on same level from slipping, tripping and stumbling without subsequent striking against object, initial encounter: Secondary | ICD-10-CM | POA: Insufficient documentation

## 2011-07-20 DIAGNOSIS — S0101XA Laceration without foreign body of scalp, initial encounter: Secondary | ICD-10-CM

## 2011-07-20 DIAGNOSIS — I10 Essential (primary) hypertension: Secondary | ICD-10-CM | POA: Insufficient documentation

## 2011-07-20 DIAGNOSIS — S0990XA Unspecified injury of head, initial encounter: Secondary | ICD-10-CM | POA: Insufficient documentation

## 2011-07-20 NOTE — ED Notes (Addendum)
Pt arrived [facility] on [facility]SB, pt placed on continuous pulse oximetry and blood pressure cuff

## 2011-07-20 NOTE — ED Notes (Signed)
Pt stapled with 4 staples by MD. Pt tol well. Bleeding controlled. Pt's lac irrigated with sterile NS prior to staple repair.

## 2011-07-20 NOTE — ED Notes (Signed)
Per pt /EMS arrival. Pt was assisting her sister over curb. Pt's sister stumbled and fell causing patient to fall onto mulch from standing position. Pt's head hit metal post. Denies LOC.~ 2cc Lac to right temporal area. C collar and spinal board intact. Bleeding controlled with dsg by EMTs.

## 2011-07-20 NOTE — ED Notes (Signed)
Bright red drainage from lac; dsg changed and pressure held; bleeding controlled. Awaiting CT scan.

## 2011-07-20 NOTE — ED Provider Notes (Signed)
History     CSN: [id number] Arrival date & time: 07/20/2011 11:48 AM   First MD Initiated Contact with Patient 07/20/11 1203      Chief Complaint  Patient presents with  . Head Laceration    (Consider location/radiation/quality/duration/timing/severity/associated sxs/prior treatment) Patient is a 77 y.o. female presenting with scalp laceration. The history is provided by the patient.  Head Laceration Pertinent negatives include no chest pain, no abdominal pain and no headaches.   the patient is a 77 year old female with hypertension, who presents to the emergency department after she fell when she stumbled over some piles of dirt.  She fell backward onto her bottom, and then struck her head against a pole.  She denies loss of consciousness.  She denies vision changes.  She denies nausea, vomiting, or neck pain.  She thinks she had her last tetanus shot within the last 5 years.  She has no pain anywhere at this time.  She is not taking any anticoagulants.  Past Medical History  Diagnosis Date  . Hypertension   . Arthritis   . Blood transfusion   . Cancer     Past Surgical History  Procedure Date  . Total knee arthroplasty     LEFT  . Eye surgery 2011    CORRECT WRINKLED RETINA  . Breast surgery   . Mastectomy, partial 2000    left side  . Shoulder surgery     bilateral shoulder replacments  . Mastectomy, partial 2000    left    Family History  Problem Relation Age of Onset  . Cancer Sister   . Heart disease Sister     History  Substance Use Topics  . Smoking status: Former Smoker    Quit date: 07/20/1979  . Smokeless tobacco: Not on file  . Alcohol Use: No    OB History    Grav Para Term Preterm Abortions TAB SAB Ect Mult Living                  Review of Systems  HENT: Negative for nosebleeds and neck pain.   Eyes: Negative for visual disturbance.  Respiratory: Negative for chest tightness.   Cardiovascular: Negative for chest pain.    Gastrointestinal: Negative for abdominal pain.  Musculoskeletal: Negative for back pain.  Skin: Positive for wound.       Approximately 3 cm scalp/right parietal air  Neurological: Negative for dizziness and headaches.  Psychiatric/Behavioral: Negative for confusion.    Allergies  Review of patient's allergies indicates no known allergies.  Home Medications   Current Outpatient Rx  Name Route Sig Dispense Refill  . ATENOLOL PO Oral Take by mouth.      . CELEBREX PO Oral Take by mouth.      Marland Kitchen LASIX PO Oral Take by mouth.        BP 207/74  Pulse 73  Temp(Src) 98.7 F (37.1 C) (Oral)  Resp 16  Ht 5\' 3"  (1.6 m)  Wt 170 lb (77.111 kg)  BMI 30.11 kg/m2  SpO2 99%  Physical Exam  Constitutional: She is oriented to person, place, and time. She appears well-developed and well-nourished.  HENT:  Head: Normocephalic.       2 cm right scalp laceration, with no active bleeding.  Eyes: Conjunctivae are normal. Pupils are equal, round, and reactive to light.  Neck: Normal range of motion. Neck supple.       No neck tender  Cardiovascular: Normal rate, regular rhythm and normal heart sounds.  Pulmonary/Chest: Effort normal and breath sounds normal.  Abdominal: Soft. Bowel sounds are normal.  Musculoskeletal: Normal range of motion. She exhibits no tenderness.  Neurological: She is alert and oriented to person, place, and time.  Skin: Skin is warm.  Psychiatric: She has a normal mood and affect.    ED Course  Procedures (including critical care time)  77 year old female, who does not take anticoagulants.  Larey Seat, struck her head.  No loss of consciousness, nausea, vomiting vision changes, weakness, or slip or paresthesias.  She got approximately 2 cm laceration in the right scalp in the parietal area.  We will scan her head and repair her wound.  Labs Reviewed - No data to display No results found.   No diagnosis found.  LACERATION REPAIR Performed by: Nicholes Stairs Authorized by: Nicholes Stairs Consent: Verbal consent obtained. Risks and benefits: risks, benefits and alternatives were discussed Consent given by: patient Patient identity confirmed: provided demographic data Prepped and Draped in normal sterile fashion Wound explored  Laceration Location: right scalp  Laceration Length: 2cm  No Foreign Bodies seen or palpated  Anesthesia: local infiltration  Local anesthetic:none Anesthetic total: none Irrigation method: syringe Amount of cleaning: standard  Skin closure:staples Number of sutures:4 staples Technique: Staple   Patient tolerance: Patient tolerated the procedure well with no immediate complications.  MDM  Scalp laceration         Nicholes Stairs, MD 07/20/11 1525

## 2011-07-28 ENCOUNTER — Ambulatory Visit (INDEPENDENT_AMBULATORY_CARE_PROVIDER_SITE_OTHER): Payer: Medicare Other | Admitting: General Surgery

## 2011-07-28 DIAGNOSIS — Z4802 Encounter for removal of sutures: Secondary | ICD-10-CM

## 2011-07-28 NOTE — Progress Notes (Signed)
PT HERE TO HAVE STAPLES REMOVED FROM SCALP AFTER VISIT TO ER AND TREATMENT FOR LACERATION OF SCALP. 4 STAPLES REMOVED WITHOUT DIFFICULTY. INCISION CLOSED WITHOUT DRAINAGE.

## 2011-10-05 ENCOUNTER — Encounter (INDEPENDENT_AMBULATORY_CARE_PROVIDER_SITE_OTHER): Payer: Self-pay | Admitting: General Surgery

## 2011-10-10 DIAGNOSIS — H268 Other specified cataract: Secondary | ICD-10-CM | POA: Diagnosis not present

## 2011-10-10 DIAGNOSIS — H04129 Dry eye syndrome of unspecified lacrimal gland: Secondary | ICD-10-CM | POA: Diagnosis not present

## 2011-10-10 DIAGNOSIS — H40019 Open angle with borderline findings, low risk, unspecified eye: Secondary | ICD-10-CM | POA: Diagnosis not present

## 2011-10-10 DIAGNOSIS — H35379 Puckering of macula, unspecified eye: Secondary | ICD-10-CM | POA: Diagnosis not present

## 2011-11-09 ENCOUNTER — Encounter (INDEPENDENT_AMBULATORY_CARE_PROVIDER_SITE_OTHER): Payer: Self-pay | Admitting: General Surgery

## 2011-11-13 ENCOUNTER — Encounter (INDEPENDENT_AMBULATORY_CARE_PROVIDER_SITE_OTHER): Payer: Self-pay | Admitting: General Surgery

## 2011-11-13 ENCOUNTER — Ambulatory Visit (INDEPENDENT_AMBULATORY_CARE_PROVIDER_SITE_OTHER): Payer: Medicare Other | Admitting: General Surgery

## 2011-11-13 VITALS — BP 130/66 | HR 79 | Temp 97.0°F | Ht 63.0 in | Wt 170.6 lb

## 2011-11-13 DIAGNOSIS — Z853 Personal history of malignant neoplasm of breast: Secondary | ICD-10-CM

## 2011-11-13 NOTE — Patient Instructions (Signed)
Continue regular self exams  

## 2011-11-15 DIAGNOSIS — M19079 Primary osteoarthritis, unspecified ankle and foot: Secondary | ICD-10-CM | POA: Diagnosis not present

## 2011-11-15 DIAGNOSIS — A5211 Tabes dorsalis: Secondary | ICD-10-CM | POA: Diagnosis not present

## 2011-11-22 ENCOUNTER — Encounter (INDEPENDENT_AMBULATORY_CARE_PROVIDER_SITE_OTHER): Payer: Self-pay | Admitting: General Surgery

## 2011-11-22 NOTE — Progress Notes (Signed)
Subjective:     Patient ID: [name], female   DOB: Jan 22, 1931, 78 y.o.   MRN: [id number]  HPI The patient is a 78 year old white female who is 12 years out from a left breast lumpectomy and negative sentinel node biopsy for a T1 B. N0 left breast cancer. Her last mammogram was in October and showed no evidence of malignancy. During the last year she has been well. She has no complaints today.  Review of Systems  Constitutional: Negative.   HENT: Negative.   Eyes: Positive for visual disturbance.  Respiratory: Negative.   Cardiovascular: Negative.   Gastrointestinal: Negative.   Genitourinary: Negative.   Musculoskeletal: Positive for arthralgias.  Skin: Negative.   Neurological: Negative.   Hematological: Negative.   Psychiatric/Behavioral: Negative.        Objective:   Physical Exam  Constitutional: She is oriented to person, place, and time. She appears well-developed and well-nourished.  HENT:  Head: Normocephalic and atraumatic.  Eyes: Conjunctivae and EOM are normal. Pupils are equal, round, and reactive to light.  Neck: Normal range of motion. Neck supple.  Cardiovascular: Normal rate, regular rhythm and normal heart sounds.   Pulmonary/Chest: Effort normal and breath sounds normal.       No palpable mass in either breast. No axillary supraclavicular or cervical lymphadenopathy  Abdominal: Soft. Bowel sounds are normal. She exhibits no mass. There is no tenderness.  Musculoskeletal: Normal range of motion.  Lymphadenopathy:    She has no cervical adenopathy.  Neurological: She is alert and oriented to person, place, and time.  Skin: Skin is warm and dry.  Psychiatric: She has a normal mood and affect. Her behavior is normal.       Assessment:     12 years status post left breast lumpectomy and negative sentinel node biopsy    Plan:     At this point she will continue to do regular self exams. We will plan to see her back in one year.

## 2012-01-10 DIAGNOSIS — M79609 Pain in unspecified limb: Secondary | ICD-10-CM | POA: Diagnosis not present

## 2012-02-06 DIAGNOSIS — I6529 Occlusion and stenosis of unspecified carotid artery: Secondary | ICD-10-CM | POA: Diagnosis not present

## 2012-02-06 DIAGNOSIS — E785 Hyperlipidemia, unspecified: Secondary | ICD-10-CM | POA: Diagnosis not present

## 2012-02-06 DIAGNOSIS — G4700 Insomnia, unspecified: Secondary | ICD-10-CM | POA: Diagnosis not present

## 2012-02-06 DIAGNOSIS — I10 Essential (primary) hypertension: Secondary | ICD-10-CM | POA: Diagnosis not present

## 2012-02-27 DIAGNOSIS — I658 Occlusion and stenosis of other precerebral arteries: Secondary | ICD-10-CM | POA: Diagnosis not present

## 2012-02-27 DIAGNOSIS — I6529 Occlusion and stenosis of unspecified carotid artery: Secondary | ICD-10-CM | POA: Diagnosis not present

## 2012-03-13 DIAGNOSIS — M19079 Primary osteoarthritis, unspecified ankle and foot: Secondary | ICD-10-CM | POA: Diagnosis not present

## 2012-03-17 HISTORY — PX: ANKLE SURGERY: SHX546

## 2012-03-21 DIAGNOSIS — M19079 Primary osteoarthritis, unspecified ankle and foot: Secondary | ICD-10-CM | POA: Diagnosis not present

## 2012-03-21 DIAGNOSIS — I10 Essential (primary) hypertension: Secondary | ICD-10-CM | POA: Diagnosis not present

## 2012-03-22 DIAGNOSIS — G609 Hereditary and idiopathic neuropathy, unspecified: Secondary | ICD-10-CM | POA: Diagnosis not present

## 2012-03-22 DIAGNOSIS — Z853 Personal history of malignant neoplasm of breast: Secondary | ICD-10-CM | POA: Diagnosis not present

## 2012-03-22 DIAGNOSIS — R262 Difficulty in walking, not elsewhere classified: Secondary | ICD-10-CM | POA: Diagnosis not present

## 2012-03-22 DIAGNOSIS — I10 Essential (primary) hypertension: Secondary | ICD-10-CM | POA: Diagnosis not present

## 2012-03-22 DIAGNOSIS — Z79899 Other long term (current) drug therapy: Secondary | ICD-10-CM | POA: Diagnosis not present

## 2012-03-22 DIAGNOSIS — M19079 Primary osteoarthritis, unspecified ankle and foot: Secondary | ICD-10-CM | POA: Diagnosis not present

## 2012-03-22 DIAGNOSIS — Z7982 Long term (current) use of aspirin: Secondary | ICD-10-CM | POA: Diagnosis not present

## 2012-03-23 DIAGNOSIS — I10 Essential (primary) hypertension: Secondary | ICD-10-CM | POA: Diagnosis not present

## 2012-03-23 DIAGNOSIS — Z7982 Long term (current) use of aspirin: Secondary | ICD-10-CM | POA: Diagnosis not present

## 2012-03-23 DIAGNOSIS — Z79899 Other long term (current) drug therapy: Secondary | ICD-10-CM | POA: Diagnosis not present

## 2012-03-23 DIAGNOSIS — G8918 Other acute postprocedural pain: Secondary | ICD-10-CM | POA: Diagnosis not present

## 2012-03-23 DIAGNOSIS — M79609 Pain in unspecified limb: Secondary | ICD-10-CM | POA: Diagnosis not present

## 2012-03-23 DIAGNOSIS — M19079 Primary osteoarthritis, unspecified ankle and foot: Secondary | ICD-10-CM | POA: Diagnosis not present

## 2012-03-23 DIAGNOSIS — Z853 Personal history of malignant neoplasm of breast: Secondary | ICD-10-CM | POA: Diagnosis not present

## 2012-03-23 DIAGNOSIS — G609 Hereditary and idiopathic neuropathy, unspecified: Secondary | ICD-10-CM | POA: Diagnosis not present

## 2012-05-06 DIAGNOSIS — M25579 Pain in unspecified ankle and joints of unspecified foot: Secondary | ICD-10-CM | POA: Diagnosis not present

## 2012-05-15 DIAGNOSIS — H43399 Other vitreous opacities, unspecified eye: Secondary | ICD-10-CM | POA: Diagnosis not present

## 2012-05-15 DIAGNOSIS — H04129 Dry eye syndrome of unspecified lacrimal gland: Secondary | ICD-10-CM | POA: Diagnosis not present

## 2012-05-15 DIAGNOSIS — H27139 Posterior dislocation of lens, unspecified eye: Secondary | ICD-10-CM | POA: Diagnosis not present

## 2012-05-15 DIAGNOSIS — Z961 Presence of intraocular lens: Secondary | ICD-10-CM | POA: Diagnosis not present

## 2012-05-15 DIAGNOSIS — H40019 Open angle with borderline findings, low risk, unspecified eye: Secondary | ICD-10-CM | POA: Diagnosis not present

## 2012-07-03 DIAGNOSIS — H35379 Puckering of macula, unspecified eye: Secondary | ICD-10-CM | POA: Diagnosis not present

## 2012-07-03 DIAGNOSIS — H27139 Posterior dislocation of lens, unspecified eye: Secondary | ICD-10-CM | POA: Diagnosis not present

## 2012-07-03 DIAGNOSIS — M25579 Pain in unspecified ankle and joints of unspecified foot: Secondary | ICD-10-CM | POA: Diagnosis not present

## 2012-07-03 DIAGNOSIS — Z96669 Presence of unspecified artificial ankle joint: Secondary | ICD-10-CM | POA: Diagnosis not present

## 2012-07-03 DIAGNOSIS — H268 Other specified cataract: Secondary | ICD-10-CM | POA: Diagnosis not present

## 2012-07-03 DIAGNOSIS — Z961 Presence of intraocular lens: Secondary | ICD-10-CM | POA: Diagnosis not present

## 2012-07-11 DIAGNOSIS — H26419 Soemmering's ring, unspecified eye: Secondary | ICD-10-CM | POA: Diagnosis not present

## 2012-07-11 DIAGNOSIS — Z7982 Long term (current) use of aspirin: Secondary | ICD-10-CM | POA: Diagnosis not present

## 2012-07-11 DIAGNOSIS — Y832 Surgical operation with anastomosis, bypass or graft as the cause of abnormal reaction of the patient, or of later complication, without mention of misadventure at the time of the procedure: Secondary | ICD-10-CM | POA: Diagnosis not present

## 2012-07-11 DIAGNOSIS — H43319 Vitreous membranes and strands, unspecified eye: Secondary | ICD-10-CM | POA: Diagnosis not present

## 2012-07-11 DIAGNOSIS — I10 Essential (primary) hypertension: Secondary | ICD-10-CM | POA: Diagnosis not present

## 2012-07-11 DIAGNOSIS — T8529XA Other mechanical complication of intraocular lens, initial encounter: Secondary | ICD-10-CM | POA: Diagnosis not present

## 2012-07-11 DIAGNOSIS — Z79899 Other long term (current) drug therapy: Secondary | ICD-10-CM | POA: Diagnosis not present

## 2012-07-11 HISTORY — PX: EYE SURGERY: SHX253

## 2012-07-12 DIAGNOSIS — H27139 Posterior dislocation of lens, unspecified eye: Secondary | ICD-10-CM | POA: Diagnosis not present

## 2012-07-12 DIAGNOSIS — H268 Other specified cataract: Secondary | ICD-10-CM | POA: Diagnosis not present

## 2012-07-15 ENCOUNTER — Other Ambulatory Visit (INDEPENDENT_AMBULATORY_CARE_PROVIDER_SITE_OTHER): Payer: Self-pay | Admitting: General Surgery

## 2012-07-15 DIAGNOSIS — Z1231 Encounter for screening mammogram for malignant neoplasm of breast: Secondary | ICD-10-CM

## 2012-07-24 DIAGNOSIS — H35379 Puckering of macula, unspecified eye: Secondary | ICD-10-CM | POA: Diagnosis not present

## 2012-07-31 DIAGNOSIS — M1990 Unspecified osteoarthritis, unspecified site: Secondary | ICD-10-CM | POA: Diagnosis not present

## 2012-07-31 DIAGNOSIS — M19079 Primary osteoarthritis, unspecified ankle and foot: Secondary | ICD-10-CM | POA: Diagnosis not present

## 2012-07-31 DIAGNOSIS — M25579 Pain in unspecified ankle and joints of unspecified foot: Secondary | ICD-10-CM | POA: Diagnosis not present

## 2012-08-01 ENCOUNTER — Encounter: Payer: Self-pay | Admitting: Vascular Surgery

## 2012-08-05 DIAGNOSIS — E785 Hyperlipidemia, unspecified: Secondary | ICD-10-CM | POA: Diagnosis not present

## 2012-08-05 DIAGNOSIS — E559 Vitamin D deficiency, unspecified: Secondary | ICD-10-CM | POA: Diagnosis not present

## 2012-08-05 DIAGNOSIS — L089 Local infection of the skin and subcutaneous tissue, unspecified: Secondary | ICD-10-CM | POA: Diagnosis not present

## 2012-08-05 DIAGNOSIS — I10 Essential (primary) hypertension: Secondary | ICD-10-CM | POA: Diagnosis not present

## 2012-08-06 DIAGNOSIS — H04129 Dry eye syndrome of unspecified lacrimal gland: Secondary | ICD-10-CM | POA: Diagnosis not present

## 2012-08-12 DIAGNOSIS — Z1331 Encounter for screening for depression: Secondary | ICD-10-CM | POA: Diagnosis not present

## 2012-08-12 DIAGNOSIS — E785 Hyperlipidemia, unspecified: Secondary | ICD-10-CM | POA: Diagnosis not present

## 2012-08-12 DIAGNOSIS — Z23 Encounter for immunization: Secondary | ICD-10-CM | POA: Diagnosis not present

## 2012-08-12 DIAGNOSIS — Z0000 Encounter for general adult medical examination without abnormal findings: Secondary | ICD-10-CM | POA: Diagnosis not present

## 2012-08-12 DIAGNOSIS — G4700 Insomnia, unspecified: Secondary | ICD-10-CM | POA: Diagnosis not present

## 2012-08-15 DIAGNOSIS — Z1212 Encounter for screening for malignant neoplasm of rectum: Secondary | ICD-10-CM | POA: Diagnosis not present

## 2012-08-19 ENCOUNTER — Ambulatory Visit
Admission: RE | Admit: 2012-08-19 | Discharge: 2012-08-19 | Disposition: A | Discharge status: Home / Self Care | Payer: Medicare Other | Source: Ambulatory Visit | Attending: General Surgery | Admitting: General Surgery

## 2012-08-19 DIAGNOSIS — Z1231 Encounter for screening mammogram for malignant neoplasm of breast: Secondary | ICD-10-CM | POA: Diagnosis not present

## 2012-08-21 DIAGNOSIS — H268 Other specified cataract: Secondary | ICD-10-CM | POA: Diagnosis not present

## 2012-08-21 DIAGNOSIS — H35379 Puckering of macula, unspecified eye: Secondary | ICD-10-CM | POA: Diagnosis not present

## 2012-08-21 DIAGNOSIS — Z961 Presence of intraocular lens: Secondary | ICD-10-CM | POA: Diagnosis not present

## 2012-08-21 DIAGNOSIS — H1830 Unspecified corneal membrane change: Secondary | ICD-10-CM | POA: Diagnosis not present

## 2012-08-21 DIAGNOSIS — H27139 Posterior dislocation of lens, unspecified eye: Secondary | ICD-10-CM | POA: Diagnosis not present

## 2012-08-27 ENCOUNTER — Telehealth (INDEPENDENT_AMBULATORY_CARE_PROVIDER_SITE_OTHER): Payer: Self-pay | Admitting: General Surgery

## 2012-08-27 NOTE — Telephone Encounter (Signed)
Message copied by Littie Deeds on Tue Aug 27, 2012 10:37 AM ------      Message from: Caleen Essex III      Created: Tue Aug 27, 2012  9:47 AM       Looks neg

## 2012-08-27 NOTE — Telephone Encounter (Signed)
Spoke with pt and informed her that her MGM came back negative.

## 2012-10-07 DIAGNOSIS — Z471 Aftercare following joint replacement surgery: Secondary | ICD-10-CM | POA: Diagnosis not present

## 2012-10-07 DIAGNOSIS — Z96669 Presence of unspecified artificial ankle joint: Secondary | ICD-10-CM | POA: Diagnosis not present

## 2012-10-07 DIAGNOSIS — M19079 Primary osteoarthritis, unspecified ankle and foot: Secondary | ICD-10-CM | POA: Diagnosis not present

## 2012-10-11 DIAGNOSIS — Z23 Encounter for immunization: Secondary | ICD-10-CM | POA: Diagnosis not present

## 2012-10-16 DIAGNOSIS — H27139 Posterior dislocation of lens, unspecified eye: Secondary | ICD-10-CM | POA: Diagnosis not present

## 2012-10-16 DIAGNOSIS — H1830 Unspecified corneal membrane change: Secondary | ICD-10-CM | POA: Diagnosis not present

## 2012-10-16 DIAGNOSIS — H35379 Puckering of macula, unspecified eye: Secondary | ICD-10-CM | POA: Diagnosis not present

## 2012-10-16 DIAGNOSIS — Z961 Presence of intraocular lens: Secondary | ICD-10-CM | POA: Diagnosis not present

## 2012-11-12 DIAGNOSIS — Z96619 Presence of unspecified artificial shoulder joint: Secondary | ICD-10-CM | POA: Diagnosis not present

## 2012-11-21 DIAGNOSIS — H43399 Other vitreous opacities, unspecified eye: Secondary | ICD-10-CM | POA: Diagnosis not present

## 2012-11-21 DIAGNOSIS — H35379 Puckering of macula, unspecified eye: Secondary | ICD-10-CM | POA: Diagnosis not present

## 2012-11-21 DIAGNOSIS — H40019 Open angle with borderline findings, low risk, unspecified eye: Secondary | ICD-10-CM | POA: Diagnosis not present

## 2012-11-21 DIAGNOSIS — Z961 Presence of intraocular lens: Secondary | ICD-10-CM | POA: Diagnosis not present

## 2012-12-11 ENCOUNTER — Encounter (INDEPENDENT_AMBULATORY_CARE_PROVIDER_SITE_OTHER): Payer: Self-pay | Admitting: General Surgery

## 2012-12-11 ENCOUNTER — Ambulatory Visit (INDEPENDENT_AMBULATORY_CARE_PROVIDER_SITE_OTHER): Payer: Medicare Other | Admitting: General Surgery

## 2012-12-11 VITALS — BP 144/78 | HR 76 | Temp 97.2°F | Ht 63.0 in | Wt 173.0 lb

## 2012-12-11 DIAGNOSIS — Z853 Personal history of malignant neoplasm of breast: Secondary | ICD-10-CM

## 2012-12-11 NOTE — Progress Notes (Signed)
Subjective:     Patient ID: [name], female   DOB: [birth year], 79 y.o.   MRN: [id number]  HPI The patient is an 79 year old white female who is 13 years status post left breast lumpectomy and negative sentinel node biopsy for a T1 B. N0 left breast cancer. Since her last visit she has had an ankle replaced in her left foot and a revision of a left high lens implant. She tolerated the surgeries well. She denies any breast pain. Her most recent mammogram was in December and showed no evidence of malignancy  Review of Systems  Constitutional: Negative.   HENT: Negative.   Eyes: Negative.   Respiratory: Negative.   Cardiovascular: Negative.   Gastrointestinal: Negative.   Endocrine: Negative.   Genitourinary: Negative.   Musculoskeletal: Negative.   Skin: Negative.   Allergic/Immunologic: Negative.   Neurological: Negative.   Hematological: Negative.   Psychiatric/Behavioral: Negative.        Objective:   Physical Exam  Constitutional: She is oriented to person, place, and time. She appears well-developed and well-nourished.  HENT:  Head: Normocephalic and atraumatic.  Eyes: Conjunctivae and EOM are normal. Pupils are equal, round, and reactive to light.  Neck: Normal range of motion. Neck supple.  Cardiovascular: Normal rate, regular rhythm and normal heart sounds.   Pulmonary/Chest: Effort normal and breath sounds normal.  There is no palpable mass in either breast. There is no palpable axillary or supraclavicular cervical lymphadenopathy  Abdominal: Soft. Bowel sounds are normal.  Musculoskeletal: Normal range of motion.  Lymphadenopathy:    She has no cervical adenopathy.  Neurological: She is alert and oriented to person, place, and time.  Skin: Skin is warm and dry.  Psychiatric: She has a normal mood and affect. Her behavior is normal.       Assessment:     The patient is 79 years status post left breast lumpectomy for breast cancer     Plan:     At this  point she will continue to do regular self exams. We will plan to see her back in one year after her next mammogram

## 2012-12-11 NOTE — Patient Instructions (Signed)
Continue regular self exams  

## 2013-02-10 DIAGNOSIS — D0590 Unspecified type of carcinoma in situ of unspecified breast: Secondary | ICD-10-CM | POA: Diagnosis not present

## 2013-02-10 DIAGNOSIS — E785 Hyperlipidemia, unspecified: Secondary | ICD-10-CM | POA: Diagnosis not present

## 2013-02-10 DIAGNOSIS — Z6830 Body mass index (BMI) 30.0-30.9, adult: Secondary | ICD-10-CM | POA: Diagnosis not present

## 2013-02-10 DIAGNOSIS — I10 Essential (primary) hypertension: Secondary | ICD-10-CM | POA: Diagnosis not present

## 2013-02-10 DIAGNOSIS — G589 Mononeuropathy, unspecified: Secondary | ICD-10-CM | POA: Diagnosis not present

## 2013-02-10 DIAGNOSIS — I6529 Occlusion and stenosis of unspecified carotid artery: Secondary | ICD-10-CM | POA: Diagnosis not present

## 2013-02-10 DIAGNOSIS — M1990 Unspecified osteoarthritis, unspecified site: Secondary | ICD-10-CM | POA: Diagnosis not present

## 2013-02-19 DIAGNOSIS — H27139 Posterior dislocation of lens, unspecified eye: Secondary | ICD-10-CM | POA: Diagnosis not present

## 2013-02-19 DIAGNOSIS — H35379 Puckering of macula, unspecified eye: Secondary | ICD-10-CM | POA: Diagnosis not present

## 2013-03-25 DIAGNOSIS — H903 Sensorineural hearing loss, bilateral: Secondary | ICD-10-CM | POA: Diagnosis not present

## 2013-03-25 DIAGNOSIS — H908 Mixed conductive and sensorineural hearing loss, unspecified: Secondary | ICD-10-CM | POA: Diagnosis not present

## 2013-04-07 DIAGNOSIS — M7730 Calcaneal spur, unspecified foot: Secondary | ICD-10-CM | POA: Diagnosis not present

## 2013-04-07 DIAGNOSIS — Z96669 Presence of unspecified artificial ankle joint: Secondary | ICD-10-CM | POA: Diagnosis not present

## 2013-04-07 DIAGNOSIS — M259 Joint disorder, unspecified: Secondary | ICD-10-CM | POA: Diagnosis not present

## 2013-04-11 DIAGNOSIS — M5137 Other intervertebral disc degeneration, lumbosacral region: Secondary | ICD-10-CM | POA: Diagnosis not present

## 2013-05-20 DIAGNOSIS — H268 Other specified cataract: Secondary | ICD-10-CM | POA: Diagnosis not present

## 2013-05-20 DIAGNOSIS — H04129 Dry eye syndrome of unspecified lacrimal gland: Secondary | ICD-10-CM | POA: Diagnosis not present

## 2013-05-20 DIAGNOSIS — H401490 Capsular glaucoma with pseudoexfoliation of lens, unspecified eye, stage unspecified: Secondary | ICD-10-CM | POA: Diagnosis not present

## 2013-07-02 DIAGNOSIS — Z23 Encounter for immunization: Secondary | ICD-10-CM | POA: Diagnosis not present

## 2013-07-29 ENCOUNTER — Other Ambulatory Visit: Payer: Self-pay

## 2013-07-29 DIAGNOSIS — Z1231 Encounter for screening mammogram for malignant neoplasm of breast: Secondary | ICD-10-CM

## 2013-08-20 ENCOUNTER — Ambulatory Visit
Admission: RE | Admit: 2013-08-20 | Discharge: 2013-08-20 | Disposition: A | Discharge status: Home / Self Care | Payer: Medicare Other | Source: Ambulatory Visit

## 2013-08-20 DIAGNOSIS — Z1231 Encounter for screening mammogram for malignant neoplasm of breast: Secondary | ICD-10-CM

## 2013-08-22 ENCOUNTER — Ambulatory Visit: Payer: Medicare Other

## 2013-08-27 DIAGNOSIS — H27139 Posterior dislocation of lens, unspecified eye: Secondary | ICD-10-CM | POA: Diagnosis not present

## 2013-08-27 DIAGNOSIS — H35379 Puckering of macula, unspecified eye: Secondary | ICD-10-CM | POA: Diagnosis not present

## 2013-11-26 DIAGNOSIS — Z961 Presence of intraocular lens: Secondary | ICD-10-CM | POA: Diagnosis not present

## 2013-11-26 DIAGNOSIS — H18599 Other hereditary corneal dystrophies, unspecified eye: Secondary | ICD-10-CM | POA: Diagnosis not present

## 2013-11-26 DIAGNOSIS — H524 Presbyopia: Secondary | ICD-10-CM | POA: Diagnosis not present

## 2013-11-26 DIAGNOSIS — H401490 Capsular glaucoma with pseudoexfoliation of lens, unspecified eye, stage unspecified: Secondary | ICD-10-CM | POA: Diagnosis not present

## 2014-01-16 ENCOUNTER — Ambulatory Visit (INDEPENDENT_AMBULATORY_CARE_PROVIDER_SITE_OTHER): Payer: Medicare Other | Admitting: General Surgery

## 2014-01-16 ENCOUNTER — Encounter (INDEPENDENT_AMBULATORY_CARE_PROVIDER_SITE_OTHER): Payer: Self-pay | Admitting: General Surgery

## 2014-01-16 VITALS — BP 132/78 | HR 72 | Temp 97.8°F | Resp 14 | Wt 177.0 lb

## 2014-01-16 DIAGNOSIS — Z853 Personal history of malignant neoplasm of breast: Secondary | ICD-10-CM

## 2014-01-16 NOTE — Progress Notes (Signed)
Subjective:     Patient ID: [name], female   DOB: [birth year], 80 y.o.   MRN: [id number]  HPI The patient is an 80 year old white female who is 14 years status post left breast lumpectomy and negative sentinel node biopsy for a T1 B. N0 left breast cancer. Since her last visit she has had no major medical illnesses. She only complains of decreased energy. She denies any breast pain. She denies any discharge from the nipple.  Review of Systems  Constitutional: Positive for fatigue.  HENT: Negative.   Eyes: Negative.   Respiratory: Negative.   Cardiovascular: Negative.   Gastrointestinal: Negative.   Endocrine: Negative.   Genitourinary: Negative.   Musculoskeletal: Negative.   Skin: Negative.   Allergic/Immunologic: Negative.   Neurological: Negative.   Hematological: Negative.   Psychiatric/Behavioral: Negative.        Objective:   Physical Exam  Constitutional: She is oriented to person, place, and time. She appears well-developed and well-nourished.  HENT:  Head: Normocephalic and atraumatic.  Eyes: Conjunctivae and EOM are normal. Pupils are equal, round, and reactive to light.  Neck: Normal range of motion. Neck supple.  Cardiovascular: Normal rate, regular rhythm and normal heart sounds.   Pulmonary/Chest: Effort normal and breath sounds normal.  Her left breast incision is healing nicely with no sign of infection. There is no palpable mass in either breast. There is no palpable axillary, supraclavicular, or cervical lymphadenopathy  Abdominal: Soft. Bowel sounds are normal.  Musculoskeletal: Normal range of motion.  Lymphadenopathy:    She has no cervical adenopathy.  Neurological: She is alert and oriented to person, place, and time.  Skin: Skin is warm and dry.  Psychiatric: She has a normal mood and affect. Her behavior is normal.       Assessment:     The patient is 14 years status post left breast lumpectomy for breast cancer. Her recent mammogram  showed no evidence of malignancy.     Plan:     At this point she will continue to do regular self exams. Our plan to see her back in one year.

## 2014-01-16 NOTE — Patient Instructions (Signed)
Continue regular self exams  

## 2014-02-17 DIAGNOSIS — E785 Hyperlipidemia, unspecified: Secondary | ICD-10-CM | POA: Diagnosis not present

## 2014-02-17 DIAGNOSIS — D0590 Unspecified type of carcinoma in situ of unspecified breast: Secondary | ICD-10-CM | POA: Diagnosis not present

## 2014-02-17 DIAGNOSIS — Z6830 Body mass index (BMI) 30.0-30.9, adult: Secondary | ICD-10-CM | POA: Diagnosis not present

## 2014-02-17 DIAGNOSIS — M899 Disorder of bone, unspecified: Secondary | ICD-10-CM | POA: Diagnosis not present

## 2014-02-17 DIAGNOSIS — M949 Disorder of cartilage, unspecified: Secondary | ICD-10-CM | POA: Diagnosis not present

## 2014-02-17 DIAGNOSIS — M1990 Unspecified osteoarthritis, unspecified site: Secondary | ICD-10-CM | POA: Diagnosis not present

## 2014-02-17 DIAGNOSIS — I10 Essential (primary) hypertension: Secondary | ICD-10-CM | POA: Diagnosis not present

## 2014-03-02 DIAGNOSIS — H35379 Puckering of macula, unspecified eye: Secondary | ICD-10-CM | POA: Diagnosis not present

## 2014-03-11 DIAGNOSIS — Z96669 Presence of unspecified artificial ankle joint: Secondary | ICD-10-CM | POA: Diagnosis not present

## 2014-03-11 DIAGNOSIS — M25579 Pain in unspecified ankle and joints of unspecified foot: Secondary | ICD-10-CM | POA: Diagnosis not present

## 2014-05-27 DIAGNOSIS — H409 Unspecified glaucoma: Secondary | ICD-10-CM | POA: Diagnosis not present

## 2014-05-27 DIAGNOSIS — H401490 Capsular glaucoma with pseudoexfoliation of lens, unspecified eye, stage unspecified: Secondary | ICD-10-CM | POA: Diagnosis not present

## 2014-06-22 DIAGNOSIS — I10 Essential (primary) hypertension: Secondary | ICD-10-CM | POA: Diagnosis not present

## 2014-06-22 DIAGNOSIS — Z6830 Body mass index (BMI) 30.0-30.9, adult: Secondary | ICD-10-CM | POA: Diagnosis not present

## 2014-06-22 DIAGNOSIS — R600 Localized edema: Secondary | ICD-10-CM | POA: Diagnosis not present

## 2014-06-22 DIAGNOSIS — Z23 Encounter for immunization: Secondary | ICD-10-CM | POA: Diagnosis not present

## 2014-06-25 DIAGNOSIS — L821 Other seborrheic keratosis: Secondary | ICD-10-CM | POA: Diagnosis not present

## 2014-06-25 DIAGNOSIS — D1800 Hemangioma unspecified site: Secondary | ICD-10-CM | POA: Diagnosis not present

## 2014-06-29 DIAGNOSIS — M67442 Ganglion, left hand: Secondary | ICD-10-CM | POA: Diagnosis not present

## 2014-07-03 DIAGNOSIS — M67442 Ganglion, left hand: Secondary | ICD-10-CM | POA: Diagnosis not present

## 2014-07-03 HISTORY — PX: GANGLION CYST EXCISION: SHX1691

## 2014-07-15 ENCOUNTER — Other Ambulatory Visit: Payer: Self-pay

## 2014-07-15 DIAGNOSIS — Z1231 Encounter for screening mammogram for malignant neoplasm of breast: Secondary | ICD-10-CM

## 2014-07-20 DIAGNOSIS — H2589 Other age-related cataract: Secondary | ICD-10-CM | POA: Diagnosis not present

## 2014-07-20 DIAGNOSIS — H04123 Dry eye syndrome of bilateral lacrimal glands: Secondary | ICD-10-CM | POA: Diagnosis not present

## 2014-07-20 DIAGNOSIS — H16223 Keratoconjunctivitis sicca, not specified as Sjogren's, bilateral: Secondary | ICD-10-CM | POA: Diagnosis not present

## 2014-07-20 DIAGNOSIS — H401430 Capsular glaucoma with pseudoexfoliation of lens, bilateral, stage unspecified: Secondary | ICD-10-CM | POA: Diagnosis not present

## 2014-07-20 DIAGNOSIS — Z961 Presence of intraocular lens: Secondary | ICD-10-CM | POA: Diagnosis not present

## 2014-08-13 DIAGNOSIS — M859 Disorder of bone density and structure, unspecified: Secondary | ICD-10-CM | POA: Diagnosis not present

## 2014-08-13 DIAGNOSIS — E559 Vitamin D deficiency, unspecified: Secondary | ICD-10-CM | POA: Diagnosis not present

## 2014-08-13 DIAGNOSIS — I10 Essential (primary) hypertension: Secondary | ICD-10-CM | POA: Diagnosis not present

## 2014-08-13 DIAGNOSIS — Z0000 Encounter for general adult medical examination without abnormal findings: Secondary | ICD-10-CM | POA: Diagnosis not present

## 2014-08-13 DIAGNOSIS — Z008 Encounter for other general examination: Secondary | ICD-10-CM | POA: Diagnosis not present

## 2014-08-13 DIAGNOSIS — E785 Hyperlipidemia, unspecified: Secondary | ICD-10-CM | POA: Diagnosis not present

## 2014-08-18 DIAGNOSIS — E785 Hyperlipidemia, unspecified: Secondary | ICD-10-CM | POA: Diagnosis not present

## 2014-08-18 DIAGNOSIS — Z0000 Encounter for general adult medical examination without abnormal findings: Secondary | ICD-10-CM | POA: Diagnosis not present

## 2014-08-18 DIAGNOSIS — H409 Unspecified glaucoma: Secondary | ICD-10-CM | POA: Diagnosis not present

## 2014-08-18 DIAGNOSIS — G4700 Insomnia, unspecified: Secondary | ICD-10-CM | POA: Diagnosis not present

## 2014-08-18 DIAGNOSIS — Z1389 Encounter for screening for other disorder: Secondary | ICD-10-CM | POA: Diagnosis not present

## 2014-08-18 DIAGNOSIS — D0512 Intraductal carcinoma in situ of left breast: Secondary | ICD-10-CM | POA: Diagnosis not present

## 2014-08-18 DIAGNOSIS — I10 Essential (primary) hypertension: Secondary | ICD-10-CM | POA: Diagnosis not present

## 2014-08-18 DIAGNOSIS — G629 Polyneuropathy, unspecified: Secondary | ICD-10-CM | POA: Diagnosis not present

## 2014-08-18 DIAGNOSIS — I6529 Occlusion and stenosis of unspecified carotid artery: Secondary | ICD-10-CM | POA: Diagnosis not present

## 2014-08-19 DIAGNOSIS — Z1212 Encounter for screening for malignant neoplasm of rectum: Secondary | ICD-10-CM | POA: Diagnosis not present

## 2014-08-21 ENCOUNTER — Ambulatory Visit
Admission: RE | Admit: 2014-08-21 | Discharge: 2014-08-21 | Disposition: A | Discharge status: Home / Self Care | Payer: Medicare Other | Source: Ambulatory Visit

## 2014-08-21 DIAGNOSIS — Z1231 Encounter for screening mammogram for malignant neoplasm of breast: Secondary | ICD-10-CM | POA: Diagnosis not present

## 2014-09-07 DIAGNOSIS — J309 Allergic rhinitis, unspecified: Secondary | ICD-10-CM | POA: Diagnosis not present

## 2014-09-07 DIAGNOSIS — R05 Cough: Secondary | ICD-10-CM | POA: Diagnosis not present

## 2014-09-07 DIAGNOSIS — Z6828 Body mass index (BMI) 28.0-28.9, adult: Secondary | ICD-10-CM | POA: Diagnosis not present

## 2014-09-22 DIAGNOSIS — H04123 Dry eye syndrome of bilateral lacrimal glands: Secondary | ICD-10-CM | POA: Diagnosis not present

## 2014-09-22 DIAGNOSIS — H16223 Keratoconjunctivitis sicca, not specified as Sjogren's, bilateral: Secondary | ICD-10-CM | POA: Diagnosis not present

## 2014-10-19 DIAGNOSIS — H40012 Open angle with borderline findings, low risk, left eye: Secondary | ICD-10-CM | POA: Diagnosis not present

## 2014-10-19 DIAGNOSIS — H16223 Keratoconjunctivitis sicca, not specified as Sjogren's, bilateral: Secondary | ICD-10-CM | POA: Diagnosis not present

## 2015-02-05 DIAGNOSIS — C50212 Malignant neoplasm of upper-inner quadrant of left female breast: Secondary | ICD-10-CM | POA: Diagnosis not present

## 2015-03-01 DIAGNOSIS — H35372 Puckering of macula, left eye: Secondary | ICD-10-CM | POA: Diagnosis not present

## 2015-03-01 DIAGNOSIS — H35359 Cystoid macular degeneration, unspecified eye: Secondary | ICD-10-CM | POA: Diagnosis not present

## 2015-03-05 DIAGNOSIS — H43393 Other vitreous opacities, bilateral: Secondary | ICD-10-CM | POA: Diagnosis not present

## 2015-04-22 DIAGNOSIS — H3531 Nonexudative age-related macular degeneration: Secondary | ICD-10-CM | POA: Diagnosis not present

## 2015-04-22 DIAGNOSIS — H16223 Keratoconjunctivitis sicca, not specified as Sjogren's, bilateral: Secondary | ICD-10-CM | POA: Diagnosis not present

## 2015-04-22 DIAGNOSIS — H2589 Other age-related cataract: Secondary | ICD-10-CM | POA: Diagnosis not present

## 2015-04-22 DIAGNOSIS — H1859 Other hereditary corneal dystrophies: Secondary | ICD-10-CM | POA: Diagnosis not present

## 2015-04-22 DIAGNOSIS — H35373 Puckering of macula, bilateral: Secondary | ICD-10-CM | POA: Diagnosis not present

## 2015-05-12 ENCOUNTER — Ambulatory Visit (INDEPENDENT_AMBULATORY_CARE_PROVIDER_SITE_OTHER): Payer: Medicare Other | Admitting: Podiatry

## 2015-05-12 ENCOUNTER — Encounter: Payer: Self-pay | Admitting: Podiatry

## 2015-05-12 VITALS — BP 110/82 | HR 69 | Temp 96.7°F | Resp 12

## 2015-05-12 DIAGNOSIS — M79675 Pain in left toe(s): Secondary | ICD-10-CM | POA: Diagnosis not present

## 2015-05-12 DIAGNOSIS — B351 Tinea unguium: Secondary | ICD-10-CM | POA: Diagnosis not present

## 2015-05-12 NOTE — Patient Instructions (Signed)
Today I trimmed out a fungal ingrowing toenail on the left great toe. The margin will regrow and return as needed. You could apply some Vaseline to the edge of the nail to soften the callus in the area

## 2015-05-12 NOTE — Progress Notes (Signed)
   Subjective:    Patient ID: [name], female    DOB: 05/22/1931, 81 y.o.   MRN: [id number]  HPI  This patient presents today complaining of a painful left hallux toenail for the past year with increasing discomfort over time aggravated with direct pressure and shoe pressure. She describes visiting a pedicurist and has the nail trim periodically which provides temporary relief. He says she does not want any permanent removal of the toenail  Review of Systems  Musculoskeletal: Positive for back pain.  Skin: Positive for color change.       Objective:   Physical Exam  Orientated 3  Vascular: DP and PT pulses 1/4 bilaterally Capillary reflex immediate bilaterally  Neurological: Sensation to 10 g monofilament wire intact 5/5 right 4/5 left Vibratory sensation reactive bilaterally Ankle reflexes equal and reactive bilaterally  Dermatological: Well-healed surgical scar anterior left ankle Absent third right toenail The remaining toenails have texture and color changes 9 The left hallux nail is C-shaped, incurvated, brittle and tender to direct palpation  Musculoskeletal: Pes planus bilaterally Hammertoe deformities 2-5 bilaterally There is no restriction ankle, subtalar, midtarsal joints bilaterally        Assessment & Plan:    Assessment: Satisfactory neurovascular status, bilaterally Painful Mycotic ingrowing left hallux toenail  Plan: I review the results of the examination with patient today. I made her aware that the nail was deformed and her options including repetitive debridement, possible oral medication to treat fungal infection or permanent nail removal. Patient states she request debridement of the nail and understands that the nail will regrow and become symptomatic. The left hallux nail was debrided mechanically and electronically without any bleeding. I suggested she could apply Vaseline to the callused nail groove.  Reappoint at patient's  request

## 2015-06-22 DIAGNOSIS — Z23 Encounter for immunization: Secondary | ICD-10-CM | POA: Diagnosis not present

## 2015-06-28 DIAGNOSIS — Z111 Encounter for screening for respiratory tuberculosis: Secondary | ICD-10-CM | POA: Diagnosis not present

## 2015-07-21 ENCOUNTER — Other Ambulatory Visit: Payer: Self-pay

## 2015-07-21 DIAGNOSIS — Z1231 Encounter for screening mammogram for malignant neoplasm of breast: Secondary | ICD-10-CM

## 2015-08-17 DIAGNOSIS — E559 Vitamin D deficiency, unspecified: Secondary | ICD-10-CM | POA: Diagnosis not present

## 2015-08-17 DIAGNOSIS — N390 Urinary tract infection, site not specified: Secondary | ICD-10-CM | POA: Diagnosis not present

## 2015-08-17 DIAGNOSIS — E784 Other hyperlipidemia: Secondary | ICD-10-CM | POA: Diagnosis not present

## 2015-08-17 DIAGNOSIS — I10 Essential (primary) hypertension: Secondary | ICD-10-CM | POA: Diagnosis not present

## 2015-08-17 DIAGNOSIS — R8299 Other abnormal findings in urine: Secondary | ICD-10-CM | POA: Diagnosis not present

## 2015-08-23 ENCOUNTER — Ambulatory Visit
Admission: RE | Admit: 2015-08-23 | Discharge: 2015-08-23 | Disposition: A | Discharge status: Home / Self Care | Payer: Medicare Other | Source: Ambulatory Visit

## 2015-08-23 DIAGNOSIS — Z1231 Encounter for screening mammogram for malignant neoplasm of breast: Secondary | ICD-10-CM | POA: Diagnosis not present

## 2015-08-24 DIAGNOSIS — Z1212 Encounter for screening for malignant neoplasm of rectum: Secondary | ICD-10-CM | POA: Diagnosis not present

## 2015-10-27 DIAGNOSIS — H1859 Other hereditary corneal dystrophies: Secondary | ICD-10-CM | POA: Diagnosis not present

## 2015-10-27 DIAGNOSIS — H2589 Other age-related cataract: Secondary | ICD-10-CM | POA: Diagnosis not present

## 2015-10-27 DIAGNOSIS — H35373 Puckering of macula, bilateral: Secondary | ICD-10-CM | POA: Diagnosis not present

## 2015-10-27 DIAGNOSIS — H16223 Keratoconjunctivitis sicca, not specified as Sjogren's, bilateral: Secondary | ICD-10-CM | POA: Diagnosis not present

## 2015-10-27 DIAGNOSIS — H40013 Open angle with borderline findings, low risk, bilateral: Secondary | ICD-10-CM | POA: Diagnosis not present

## 2016-02-03 DIAGNOSIS — C50212 Malignant neoplasm of upper-inner quadrant of left female breast: Secondary | ICD-10-CM | POA: Diagnosis not present

## 2016-02-24 DIAGNOSIS — M859 Disorder of bone density and structure, unspecified: Secondary | ICD-10-CM | POA: Diagnosis not present

## 2016-02-24 DIAGNOSIS — E559 Vitamin D deficiency, unspecified: Secondary | ICD-10-CM | POA: Diagnosis not present

## 2016-02-29 DIAGNOSIS — H353131 Nonexudative age-related macular degeneration, bilateral, early dry stage: Secondary | ICD-10-CM | POA: Diagnosis not present

## 2016-02-29 DIAGNOSIS — H35361 Drusen (degenerative) of macula, right eye: Secondary | ICD-10-CM | POA: Diagnosis not present

## 2016-02-29 DIAGNOSIS — H35371 Puckering of macula, right eye: Secondary | ICD-10-CM | POA: Diagnosis not present

## 2016-02-29 DIAGNOSIS — H35372 Puckering of macula, left eye: Secondary | ICD-10-CM | POA: Diagnosis not present

## 2016-04-10 DIAGNOSIS — Z471 Aftercare following joint replacement surgery: Secondary | ICD-10-CM | POA: Diagnosis not present

## 2016-04-10 DIAGNOSIS — Z96662 Presence of left artificial ankle joint: Secondary | ICD-10-CM | POA: Diagnosis not present

## 2016-05-01 DIAGNOSIS — H40013 Open angle with borderline findings, low risk, bilateral: Secondary | ICD-10-CM | POA: Diagnosis not present

## 2016-05-01 DIAGNOSIS — H1859 Other hereditary corneal dystrophies: Secondary | ICD-10-CM | POA: Diagnosis not present

## 2016-05-01 DIAGNOSIS — H2589 Other age-related cataract: Secondary | ICD-10-CM | POA: Diagnosis not present

## 2016-05-01 DIAGNOSIS — H16223 Keratoconjunctivitis sicca, not specified as Sjogren's, bilateral: Secondary | ICD-10-CM | POA: Diagnosis not present

## 2016-05-01 DIAGNOSIS — H35373 Puckering of macula, bilateral: Secondary | ICD-10-CM | POA: Diagnosis not present

## 2016-06-12 DIAGNOSIS — Z6827 Body mass index (BMI) 27.0-27.9, adult: Secondary | ICD-10-CM | POA: Diagnosis not present

## 2016-06-12 DIAGNOSIS — W0110XA Fall on same level from slipping, tripping and stumbling with subsequent striking against unspecified object, initial encounter: Secondary | ICD-10-CM | POA: Diagnosis not present

## 2016-06-12 DIAGNOSIS — R0781 Pleurodynia: Secondary | ICD-10-CM | POA: Diagnosis not present

## 2016-07-21 ENCOUNTER — Other Ambulatory Visit: Payer: Self-pay | Admitting: General Surgery

## 2016-07-21 DIAGNOSIS — Z1231 Encounter for screening mammogram for malignant neoplasm of breast: Secondary | ICD-10-CM

## 2016-08-22 DIAGNOSIS — N390 Urinary tract infection, site not specified: Secondary | ICD-10-CM | POA: Diagnosis not present

## 2016-08-22 DIAGNOSIS — I10 Essential (primary) hypertension: Secondary | ICD-10-CM | POA: Diagnosis not present

## 2016-08-22 DIAGNOSIS — E784 Other hyperlipidemia: Secondary | ICD-10-CM | POA: Diagnosis not present

## 2016-08-22 DIAGNOSIS — M859 Disorder of bone density and structure, unspecified: Secondary | ICD-10-CM | POA: Diagnosis not present

## 2016-08-23 ENCOUNTER — Ambulatory Visit
Admission: RE | Admit: 2016-08-23 | Discharge: 2016-08-23 | Disposition: A | Discharge status: Home / Self Care | Payer: Medicare Other | Source: Ambulatory Visit | Attending: General Surgery | Admitting: General Surgery

## 2016-08-23 DIAGNOSIS — Z1231 Encounter for screening mammogram for malignant neoplasm of breast: Secondary | ICD-10-CM

## 2016-09-22 DIAGNOSIS — J309 Allergic rhinitis, unspecified: Secondary | ICD-10-CM | POA: Diagnosis not present

## 2016-09-22 DIAGNOSIS — J449 Chronic obstructive pulmonary disease, unspecified: Secondary | ICD-10-CM | POA: Diagnosis not present

## 2016-09-22 DIAGNOSIS — J069 Acute upper respiratory infection, unspecified: Secondary | ICD-10-CM | POA: Diagnosis not present

## 2016-09-22 DIAGNOSIS — Z6827 Body mass index (BMI) 27.0-27.9, adult: Secondary | ICD-10-CM | POA: Diagnosis not present

## 2016-10-17 DIAGNOSIS — Z1212 Encounter for screening for malignant neoplasm of rectum: Secondary | ICD-10-CM | POA: Diagnosis not present

## 2016-11-01 DIAGNOSIS — H35373 Puckering of macula, bilateral: Secondary | ICD-10-CM | POA: Diagnosis not present

## 2016-11-01 DIAGNOSIS — H16223 Keratoconjunctivitis sicca, not specified as Sjogren's, bilateral: Secondary | ICD-10-CM | POA: Diagnosis not present

## 2016-11-01 DIAGNOSIS — H1859 Other hereditary corneal dystrophies: Secondary | ICD-10-CM | POA: Diagnosis not present

## 2016-11-01 DIAGNOSIS — H2589 Other age-related cataract: Secondary | ICD-10-CM | POA: Diagnosis not present

## 2016-11-01 DIAGNOSIS — H353130 Nonexudative age-related macular degeneration, bilateral, stage unspecified: Secondary | ICD-10-CM | POA: Diagnosis not present

## 2016-11-01 DIAGNOSIS — H5340 Unspecified visual field defects: Secondary | ICD-10-CM | POA: Diagnosis not present

## 2016-12-07 DIAGNOSIS — Z96611 Presence of right artificial shoulder joint: Secondary | ICD-10-CM | POA: Diagnosis not present

## 2016-12-07 DIAGNOSIS — Z96612 Presence of left artificial shoulder joint: Secondary | ICD-10-CM | POA: Diagnosis not present

## 2016-12-07 DIAGNOSIS — M25511 Pain in right shoulder: Secondary | ICD-10-CM | POA: Diagnosis not present

## 2016-12-13 DIAGNOSIS — M25511 Pain in right shoulder: Secondary | ICD-10-CM | POA: Diagnosis not present

## 2016-12-13 DIAGNOSIS — Z96611 Presence of right artificial shoulder joint: Secondary | ICD-10-CM | POA: Diagnosis not present

## 2017-02-23 DIAGNOSIS — C50212 Malignant neoplasm of upper-inner quadrant of left female breast: Secondary | ICD-10-CM | POA: Diagnosis not present

## 2017-03-01 DIAGNOSIS — H353131 Nonexudative age-related macular degeneration, bilateral, early dry stage: Secondary | ICD-10-CM | POA: Diagnosis not present

## 2017-03-01 DIAGNOSIS — H35372 Puckering of macula, left eye: Secondary | ICD-10-CM | POA: Diagnosis not present

## 2017-05-07 DIAGNOSIS — H16223 Keratoconjunctivitis sicca, not specified as Sjogren's, bilateral: Secondary | ICD-10-CM | POA: Diagnosis not present

## 2017-05-07 DIAGNOSIS — H1859 Other hereditary corneal dystrophies: Secondary | ICD-10-CM | POA: Diagnosis not present

## 2017-05-07 DIAGNOSIS — H2589 Other age-related cataract: Secondary | ICD-10-CM | POA: Diagnosis not present

## 2017-05-07 DIAGNOSIS — H35373 Puckering of macula, bilateral: Secondary | ICD-10-CM | POA: Diagnosis not present

## 2017-05-07 DIAGNOSIS — H40013 Open angle with borderline findings, low risk, bilateral: Secondary | ICD-10-CM | POA: Diagnosis not present

## 2017-05-07 DIAGNOSIS — H353130 Nonexudative age-related macular degeneration, bilateral, stage unspecified: Secondary | ICD-10-CM | POA: Diagnosis not present

## 2017-07-04 DIAGNOSIS — Z96611 Presence of right artificial shoulder joint: Secondary | ICD-10-CM | POA: Diagnosis not present

## 2017-07-04 DIAGNOSIS — M25511 Pain in right shoulder: Secondary | ICD-10-CM | POA: Diagnosis not present

## 2017-07-17 ENCOUNTER — Other Ambulatory Visit: Payer: Self-pay | Admitting: Internal Medicine

## 2017-07-17 DIAGNOSIS — Z1231 Encounter for screening mammogram for malignant neoplasm of breast: Secondary | ICD-10-CM

## 2017-08-23 DIAGNOSIS — E559 Vitamin D deficiency, unspecified: Secondary | ICD-10-CM | POA: Diagnosis not present

## 2017-08-23 DIAGNOSIS — R82998 Other abnormal findings in urine: Secondary | ICD-10-CM | POA: Diagnosis not present

## 2017-08-23 DIAGNOSIS — I10 Essential (primary) hypertension: Secondary | ICD-10-CM | POA: Diagnosis not present

## 2017-08-23 DIAGNOSIS — E7849 Other hyperlipidemia: Secondary | ICD-10-CM | POA: Diagnosis not present

## 2017-08-24 ENCOUNTER — Ambulatory Visit
Admission: RE | Admit: 2017-08-24 | Discharge: 2017-08-24 | Disposition: A | Discharge status: Home / Self Care | Payer: Medicare Other | Source: Ambulatory Visit | Attending: Internal Medicine | Admitting: Internal Medicine

## 2017-08-24 DIAGNOSIS — Z1231 Encounter for screening mammogram for malignant neoplasm of breast: Secondary | ICD-10-CM | POA: Diagnosis not present

## 2017-08-30 DIAGNOSIS — Z1212 Encounter for screening for malignant neoplasm of rectum: Secondary | ICD-10-CM | POA: Diagnosis not present

## 2017-09-26 DIAGNOSIS — H903 Sensorineural hearing loss, bilateral: Secondary | ICD-10-CM | POA: Diagnosis not present

## 2017-10-31 DIAGNOSIS — H35373 Puckering of macula, bilateral: Secondary | ICD-10-CM | POA: Diagnosis not present

## 2017-10-31 DIAGNOSIS — H40013 Open angle with borderline findings, low risk, bilateral: Secondary | ICD-10-CM | POA: Diagnosis not present

## 2017-10-31 DIAGNOSIS — H1859 Other hereditary corneal dystrophies: Secondary | ICD-10-CM | POA: Diagnosis not present

## 2017-10-31 DIAGNOSIS — H16223 Keratoconjunctivitis sicca, not specified as Sjogren's, bilateral: Secondary | ICD-10-CM | POA: Diagnosis not present

## 2017-10-31 DIAGNOSIS — H353131 Nonexudative age-related macular degeneration, bilateral, early dry stage: Secondary | ICD-10-CM | POA: Diagnosis not present

## 2017-10-31 DIAGNOSIS — H2589 Other age-related cataract: Secondary | ICD-10-CM | POA: Diagnosis not present

## 2017-11-10 ENCOUNTER — Observation Stay (HOSPITAL_COMMUNITY)
Admission: EM | Admit: 2017-11-10 | Discharge: 2017-11-11 | Disposition: A | Discharge status: Home / Self Care | Payer: Medicare Other | Attending: Internal Medicine | Admitting: Internal Medicine

## 2017-11-10 ENCOUNTER — Encounter (HOSPITAL_COMMUNITY): Payer: Self-pay | Admitting: Emergency Medicine

## 2017-11-10 ENCOUNTER — Emergency Department (HOSPITAL_COMMUNITY): Payer: Medicare Other

## 2017-11-10 DIAGNOSIS — Z96653 Presence of artificial knee joint, bilateral: Secondary | ICD-10-CM | POA: Insufficient documentation

## 2017-11-10 DIAGNOSIS — G459 Transient cerebral ischemic attack, unspecified: Secondary | ICD-10-CM | POA: Diagnosis not present

## 2017-11-10 DIAGNOSIS — Z79899 Other long term (current) drug therapy: Secondary | ICD-10-CM | POA: Diagnosis not present

## 2017-11-10 DIAGNOSIS — I10 Essential (primary) hypertension: Secondary | ICD-10-CM | POA: Insufficient documentation

## 2017-11-10 DIAGNOSIS — R531 Weakness: Secondary | ICD-10-CM | POA: Diagnosis not present

## 2017-11-10 DIAGNOSIS — Z7982 Long term (current) use of aspirin: Secondary | ICD-10-CM | POA: Insufficient documentation

## 2017-11-10 DIAGNOSIS — I6523 Occlusion and stenosis of bilateral carotid arteries: Secondary | ICD-10-CM | POA: Diagnosis not present

## 2017-11-10 DIAGNOSIS — I639 Cerebral infarction, unspecified: Secondary | ICD-10-CM

## 2017-11-10 DIAGNOSIS — I6529 Occlusion and stenosis of unspecified carotid artery: Secondary | ICD-10-CM

## 2017-11-10 HISTORY — DX: Transient cerebral ischemic attack, unspecified: G45.9

## 2017-11-10 LAB — I-STAT CHEM 8, ED
BUN: 39 mg/dL — ABNORMAL HIGH (ref 6–20)
Calcium, Ion: 1.16 mmol/L (ref 1.15–1.40)
Chloride: 103 mmol/L (ref 101–111)
Creatinine, Ser: 0.8 mg/dL (ref 0.44–1.00)
Glucose, Bld: 104 mg/dL — ABNORMAL HIGH (ref 65–99)
HEMATOCRIT: 42 % (ref 36.0–46.0)
HEMOGLOBIN: 14.3 g/dL (ref 12.0–15.0)
POTASSIUM: 4.5 mmol/L (ref 3.5–5.1)
SODIUM: 140 mmol/L (ref 135–145)
TCO2: 28 mmol/L (ref 22–32)

## 2017-11-10 LAB — COMPREHENSIVE METABOLIC PANEL
ALT: 16 U/L (ref 14–54)
ANION GAP: 11 (ref 5–15)
AST: 29 U/L (ref 15–41)
Albumin: 3.7 g/dL (ref 3.5–5.0)
Alkaline Phosphatase: 77 U/L (ref 38–126)
BUN: 41 mg/dL — ABNORMAL HIGH (ref 6–20)
CHLORIDE: 103 mmol/L (ref 101–111)
CO2: 25 mmol/L (ref 22–32)
CREATININE: 0.8 mg/dL (ref 0.44–1.00)
Calcium: 9.4 mg/dL (ref 8.9–10.3)
GFR calc Af Amer: >60 mL/min (ref >60)
GFR calc non Af Amer: >60 mL/min (ref >60)
Glucose, Bld: 111 mg/dL — ABNORMAL HIGH (ref 65–99)
POTASSIUM: 4.7 mmol/L (ref 3.5–5.1)
SODIUM: 139 mmol/L (ref 135–145)
Total Bilirubin: 0.6 mg/dL (ref 0.3–1.2)
Total Protein: 6.8 g/dL (ref 6.5–8.1)

## 2017-11-10 LAB — CBC
HCT: 41.7 % (ref 36.0–46.0)
Hemoglobin: 14 g/dL (ref 12.0–15.0)
MCH: 30.6 pg (ref 26.0–34.0)
MCHC: 33.6 g/dL (ref 30.0–36.0)
MCV: 91 fL (ref 78.0–100.0)
PLATELETS: 179 10*3/uL (ref 150–400)
RBC: 4.58 MIL/uL (ref 3.87–5.11)
RDW: 14.1 % (ref 11.5–15.5)
WBC: 5.9 10*3/uL (ref 4.0–10.5)

## 2017-11-10 LAB — CBG MONITORING, ED: GLUCOSE-CAPILLARY: 100 mg/dL — AB (ref 65–99)

## 2017-11-10 LAB — DIFFERENTIAL
BASOS ABS: 0 10*3/uL (ref 0.0–0.1)
BASOS PCT: 1 %
EOS ABS: 0.3 10*3/uL (ref 0.0–0.7)
Eosinophils Relative: 5 %
Lymphocytes Relative: 26 %
Lymphs Abs: 1.5 10*3/uL (ref 0.7–4.0)
MONO ABS: 0.4 10*3/uL (ref 0.1–1.0)
Monocytes Relative: 6 %
NEUTROS ABS: 3.7 10*3/uL (ref 1.7–7.7)
Neutrophils Relative %: 62 %

## 2017-11-10 LAB — I-STAT TROPONIN, ED: Troponin i, poc: 0 ng/mL (ref 0.00–0.08)

## 2017-11-10 LAB — APTT: APTT: 30 s (ref 24–36)

## 2017-11-10 LAB — PROTIME-INR
INR: 0.97
PROTHROMBIN TIME: 12.8 s (ref 11.4–15.2)

## 2017-11-10 MED ORDER — ENOXAPARIN SODIUM 40 MG/0.4ML ~~LOC~~ SOLN
40.0000 mg | SUBCUTANEOUS | Status: DC
  Administered 2017-11-11: 40 mg via SUBCUTANEOUS
  Filled 2017-11-10: qty 0.4

## 2017-11-10 MED ORDER — DORZOLAMIDE HCL-TIMOLOL MAL 2-0.5 % OP SOLN
1.0000 [drp] | Freq: Two times a day (BID) | OPHTHALMIC | Status: DC
  Administered 2017-11-11: 1 [drp] via OPHTHALMIC
  Filled 2017-11-10: qty 10

## 2017-11-10 MED ORDER — IOPAMIDOL (ISOVUE-370) INJECTION 76%
INTRAVENOUS | Status: AC
  Administered 2017-11-10: 50 mL
  Filled 2017-11-10: qty 50

## 2017-11-10 MED ORDER — STROKE: EARLY STAGES OF RECOVERY BOOK
Freq: Once | Status: AC
  Administered 2017-11-11: 07:00:00
  Filled 2017-11-10: qty 1

## 2017-11-10 MED ORDER — SENNOSIDES-DOCUSATE SODIUM 8.6-50 MG PO TABS: 1.0000 | ORAL_TABLET | Freq: Every evening | ORAL | Status: DC | PRN

## 2017-11-10 NOTE — ED Triage Notes (Signed)
Brought to ER by daughter for c/o left hand weakness that started suddenly at [place] while trying to eat dinner.  No other symptoms noted.  Sensation okay.

## 2017-11-10 NOTE — ED Notes (Signed)
Dr. Cheral Marker in to see patient in triage, verbal for CTs.  States not to call code stroke at this time.

## 2017-11-10 NOTE — H&P (Addendum)
History and Physical  [name] PJA:[id number] DOB: November 24, 1930 DOA: 11/10/2017  Referring physician: Bethann Punches PCP: Shon Baton, MD   Chief Complaint:  Transient L hand grip weakness  HPI: [name] is a 84 y.o. female with hx of L breast CA (remote) s/p mastectomy, arthritis, HTN and known R ICA stenosis who presented with transient L hand grip weakness during dinner. No dysarthria, headache, vision loss or other weakness. Also had 2 episodes of migraines earlier in the day. L hand weakness resolved spontaneously after 20-30 minutes, although has residual "discomfort." In the past, she has noted infrequent transient bilateral forearm weakness and discomfort "tightness" after exercise ~ has about 2-3 episodes a year.  She take celecoxib for severe arthritis and is aware of cardiac/neuro risk factors. Otherwise no prior known cardiac history except for HTN, although is prescribed PO lasix for leg edema. Her R ICA stenosis has been monitored by her outpatient doctors for the past few years, although unsure when she had last carotid dopplers.  No fevers/chills, resp or urinary sx or abdominal sx. Denies history of stroke or TIA that she is aware of. No history of MI.   ED course T 98.4 P 76 BP 151/85 RR 16 Sat 98% on RA CT head showing no large stroke or hemorrhage. CTA neck with pertinent findings as below:  Aortic arch: Standard branching. Moderate calcific atherosclerosis. Dense calcified plaque of left subclavian artery origin with moderate to severe approximately 70% stenosis (series 9, image 162), accurate assessment of luminal stenosis suboptimal due to dense calcification.  Right carotid system: Severe mixed plaque of the carotid bifurcation with severe greater than 70% proximal ICA stenosis.  Left carotid system: Severe predominantly calcified plaque of the carotid bifurcation with mild to moderate 50% proximal ICA stenosis.  Vertebral arteries: Right  dominant. Left V1 segment partially obscured by photon starvation artifact. No evidence of dissection, stenosis (50% or greater) or occlusion.   Review of Systems: All systems reviewed and apart from history of presenting illness, are negative.  Past Medical History:  Diagnosis Date  . Arthritis   . Blood transfusion   . Cancer ([facility]) 2000   L breast  . Hypertension    Past Surgical History:  Procedure Laterality Date  . ankle replacement surgery    . BREAST SURGERY    . EYE SURGERY  2011   CORRECT WRINKLED RETINA  . EYE SURGERY  07/11/12  . MASTECTOMY, PARTIAL  2000   left side  . MASTECTOMY, PARTIAL  2000   left  . SHOULDER SURGERY     bilateral shoulder replacments  . TOTAL KNEE ARTHROPLASTY     LEFT  . TOTAL KNEE ARTHROPLASTY  11--04-2009   right    Social History:  reports that she quit smoking about 38 years ago. she has never used smokeless tobacco. She reports that she does not drink alcohol or use drugs.   Allergies  Allergen Reactions  . Lisinopril     Family History  Problem Relation Age of Onset  . Cancer Sister   . Heart disease Sister   . Breast cancer Sister        ? age of onset     Prior to Admission medications   Medication Sig Start Date End Date Taking? Authorizing Provider  amLODipine (NORVASC) 2.5 MG tablet Take 2.5 mg by mouth daily.   Yes [provider]  ALPRAZolam Duanne Moron) 0.25 MG tablet  10/27/11   [provider]  aspirin 81 MG tablet Take 81 mg by mouth daily.      [provider]  ATENOLOL PO Take 50 mg by mouth at bedtime.     [provider]  calcium carbonate (OS-CAL - DOSED IN MG OF ELEMENTAL CALCIUM) 1250 MG tablet Take 1 tablet by mouth 2 (two) times daily.      [provider]  Carboxymethylcellulose Sodium (REFRESH PLUS OP) Apply to eye.    [provider]  Celecoxib (CELEBREX PO) Take 100 mg by mouth 2 (two) times daily. With breakfast and dinner    [provider]  dorzolamide-timolol (COSOPT) 22.3-6.8 MG/ML ophthalmic solution  12/05/12   [provider]  furosemide (LASIX) 40 MG tablet Take 40 mg by mouth daily.      [provider]  glucosamine-chondroitin 500-400 MG tablet Take 1 tablet by mouth 2 (two) times daily. Hold while in hospital     [provider]  loratadine (CLARITIN) 10 MG tablet Take 10 mg by mouth daily.    [provider]  omega-3 acid ethyl esters (LOVAZA) 1 G capsule Take 2 g by mouth 2 (two) times daily.      [provider]  Polyethyl Glycol-Propyl Glycol (SYSTANE OP) Apply to eye.    [provider]  Red Yeast Rice Extract (RED YEAST RICE PO) Take by mouth 2 (two) times daily. Hold while in hospital     [provider]  Sodium Chloride, Diagnostic, 0.9 % SOLN Inject as directed.    [provider]  vitamin B-12 (CYANOCOBALAMIN) 100 MCG tablet Take 50 mcg by mouth daily.      [provider]  vitamin C (ASCORBIC ACID) 500 MG tablet Take 500 mg by mouth daily.      [provider]     Physical Exam: Vitals:   11/10/17 2024  BP: (!) 151/85  Pulse: 76  Resp: 16  Temp: 98.4 F (36.9 C)  TempSrc: Oral  SpO2: 98%  Weight: 72.6 kg (160 lb)  Height: 5' 2"  (1.575 m)   PHYSICAL EXAM  BP (!) 151/85 (BP Location: Right Arm)   Pulse 76   Temp 98.4 F (36.9 C) (Oral)   Resp 16   Ht 5' 2"  (1.575 m)   Wt 72.6 kg (160 lb)   SpO2 98%   BMI 29.26 kg/m   GEN thin elderly caucasian female; resting comfortably in bed  HEENT NCAT EOM PERRL; clear oropharynx, no cervical LAD; moist mucus membranes  JVP estimated 6 cm H2O above RA; no HJR ; + R and L carotid bruits b/l with R sided bruit louder than L CV regular normal rate; loud S1 and S2; no m/r/g or S3/S4; PMI diffuse; no parasternal heave  RESP CTA b/l; breathing unlabored and symmetric  ABD soft NT ND +normoactive BS  EXT warm throughout b/l; non pitting ankle edema b/l  PULSES 2+ DP  and radials intact and symmetric  SKIN/MSK no rashes or lesions  NEURO/PSYCH no focal deficits. LUE and RUE strength symmetric. AAOx4. No other weakness noted. Visual fields intact and full to confrontation   Labs on Admission:  Basic Metabolic Panel: Recent Labs  Lab 11/10/17 2036 11/10/17 2055  NA 139 140  K 4.7 4.5  CL 103 103  CO2 25  --   GLUCOSE 111* 104*  BUN 41* 39*  CREATININE 0.80 0.80  CALCIUM 9.4  --    Liver Function Tests: Recent Labs  Lab 11/10/17 2036  AST  29  ALT 16  ALKPHOS 77  BILITOT 0.6  PROT 6.8  ALBUMIN 3.7   No results for input(s): LIPASE, AMYLASE in the last 168 hours. No results for input(s): AMMONIA in the last 168 hours. CBC: Recent Labs  Lab 11/10/17 2036 11/10/17 2055  WBC 5.9  --   NEUTROABS 3.7  --   HGB 14.0 14.3  HCT 41.7 42.0  MCV 91.0  --   PLT 179  --    Cardiac Enzymes: No results for input(s): CKTOTAL, CKMB, CKMBINDEX, TROPONINI in the last 168 hours.  BNP (last 3 results) No results for input(s): PROBNP in the last 8760 hours. CBG: Recent Labs  Lab 11/10/17 2039  GLUCAP 100*    Radiological Exams on Admission: Ct Angio Head W Or Wo Contrast  Result Date: 11/10/2017 CLINICAL DATA:  84 y/o  F; left hand weakness. EXAM: CT ANGIOGRAPHY HEAD AND NECK TECHNIQUE: Multidetector CT imaging of the head and neck was performed using the standard protocol during bolus administration of intravenous contrast. Multiplanar CT image reconstructions and MIPs were obtained to evaluate the vascular anatomy. Carotid stenosis measurements (when applicable) are obtained utilizing NASCET criteria, using the distal internal carotid diameter as the denominator. CONTRAST:  70m ISOVUE-370 IOPAMIDOL (ISOVUE-370) INJECTION 76% COMPARISON:  07/20/2011 CT head FINDINGS: CT HEAD FINDINGS Brain: No evidence of acute infarction, hemorrhage, hydrocephalus, extra-axial collection or mass lesion/mass effect. Few nonspecific punctate calcifications are  present within sulci over the convexities likely representing sequelae of prior infectious or inflammatory process. Vascular: As below. Skull: Normal. Negative for fracture or focal lesion. Sinuses: Right mastoid tip effusion.  Otherwise negative. Orbits: No acute finding.  Bilateral intra-ocular lens replacement. Review of the MIP images confirms the above findings CTA NECK FINDINGS Aortic arch: Standard branching. Moderate calcific atherosclerosis. Dense calcified plaque of left subclavian artery origin with moderate to severe approximately 70% stenosis (series 9, image 162), accurate assessment of luminal stenosis suboptimal due to dense calcification. Right carotid system: Severe mixed plaque of the carotid bifurcation with severe greater than 70% proximal ICA stenosis. Left carotid system: Severe predominantly calcified plaque of the carotid bifurcation with mild to moderate 50% proximal ICA stenosis. Vertebral arteries: Right dominant. Left V1 segment partially obscured by photon starvation artifact. No evidence of dissection, stenosis (50% or greater) or occlusion. Skeleton: Severe cervical spondylosis with predominantly discogenic degenerative changes. Grade 1 C2-3 and C7-T1 anterolisthesis. C3-4 interbody fusion. Uncovertebral and facet hypertrophy with bilateral bony foraminal encroachment throughout the cervical spine. Other neck: Subcentimeter thyroid nodules. Upper chest: Clustered nodules in the right upper lobe along the major fissure the largest measuring 15 x 12 mm (series 9, image 186). Additional small cluster of nodules peripherally in the superior segment of right lower lobe (series 9, image 180). Review of the MIP images confirms the above findings CTA HEAD FINDINGS Anterior circulation: No significant stenosis, proximal occlusion, aneurysm, or vascular malformation. Mild calcific atherosclerosis of carotid siphons without significant stenosis. Posterior circulation: No significant stenosis,  proximal occlusion, aneurysm, or vascular malformation. Venous sinuses: As permitted by contrast timing, patent. Anatomic variants: Complete circle-of-Willis.  Fetal left PCA. Delayed phase: No abnormal intracranial enhancement. Review of the MIP images confirms the above findings IMPRESSION: CT head: 1. No acute intracranial abnormality identified. 2. ASPECTS is 10. 3. Few nonspecific punctate calcifications within sulci over convexities, probably sequelae of old infectious or inflammatory process. CTA neck: 1. Left subclavian artery origin moderate to severe 70% stenosis with dense calcified plaque. Accurate assessment of luminal  stenosis suboptimal due to dense calcification. 2. Right proximal ICA severe greater than 70% stenosis with severe mixed plaque including dense calcification. 3. Left proximal ICA mild to moderate 50% stenosis with severe predominantly calcified plaque. 4. Patent bilateral vertebral arteries. No evidence for dissection or aneurysm. 5. Severe cervical spine degenerative changes. 6. Clustered nodules in right upper lobe and superior segment of right lower lobe probably representing infectious bronchiolitis. The largest nodular opacity measures 15 mm. Non-contrast chest CT at 3-6 months is recommended. If the nodules are stable at time of repeat CT, then future CT at 18-24 months (from today's scan) is considered optional for low-risk patients, but is recommended for high-risk patients. This recommendation follows the consensus statement: Guidelines for Management of Incidental Pulmonary Nodules Detected on CT Images: From the Fleischner Society 2017; Radiology 2017; 284:228-243. CTA head: Patent anterior and posterior intracranial circulation. No large vessel occlusion, aneurysm, or significant stenosis. Electronically Signed   By: Kristine Garbe M.D.   On: 11/10/2017 22:18   Ct Angio Neck W Or Wo Contrast  Result Date: 11/10/2017 CLINICAL DATA:  84 y/o  F; left hand weakness.  EXAM: CT ANGIOGRAPHY HEAD AND NECK TECHNIQUE: Multidetector CT imaging of the head and neck was performed using the standard protocol during bolus administration of intravenous contrast. Multiplanar CT image reconstructions and MIPs were obtained to evaluate the vascular anatomy. Carotid stenosis measurements (when applicable) are obtained utilizing NASCET criteria, using the distal internal carotid diameter as the denominator. CONTRAST:  38m ISOVUE-370 IOPAMIDOL (ISOVUE-370) INJECTION 76% COMPARISON:  07/20/2011 CT head FINDINGS: CT HEAD FINDINGS Brain: No evidence of acute infarction, hemorrhage, hydrocephalus, extra-axial collection or mass lesion/mass effect. Few nonspecific punctate calcifications are present within sulci over the convexities likely representing sequelae of prior infectious or inflammatory process. Vascular: As below. Skull: Normal. Negative for fracture or focal lesion. Sinuses: Right mastoid tip effusion.  Otherwise negative. Orbits: No acute finding.  Bilateral intra-ocular lens replacement. Review of the MIP images confirms the above findings CTA NECK FINDINGS Aortic arch: Standard branching. Moderate calcific atherosclerosis. Dense calcified plaque of left subclavian artery origin with moderate to severe approximately 70% stenosis (series 9, image 162), accurate assessment of luminal stenosis suboptimal due to dense calcification. Right carotid system: Severe mixed plaque of the carotid bifurcation with severe greater than 70% proximal ICA stenosis. Left carotid system: Severe predominantly calcified plaque of the carotid bifurcation with mild to moderate 50% proximal ICA stenosis. Vertebral arteries: Right dominant. Left V1 segment partially obscured by photon starvation artifact. No evidence of dissection, stenosis (50% or greater) or occlusion. Skeleton: Severe cervical spondylosis with predominantly discogenic degenerative changes. Grade 1 C2-3 and C7-T1 anterolisthesis. C3-4  interbody fusion. Uncovertebral and facet hypertrophy with bilateral bony foraminal encroachment throughout the cervical spine. Other neck: Subcentimeter thyroid nodules. Upper chest: Clustered nodules in the right upper lobe along the major fissure the largest measuring 15 x 12 mm (series 9, image 186). Additional small cluster of nodules peripherally in the superior segment of right lower lobe (series 9, image 180). Review of the MIP images confirms the above findings CTA HEAD FINDINGS Anterior circulation: No significant stenosis, proximal occlusion, aneurysm, or vascular malformation. Mild calcific atherosclerosis of carotid siphons without significant stenosis. Posterior circulation: No significant stenosis, proximal occlusion, aneurysm, or vascular malformation. Venous sinuses: As permitted by contrast timing, patent. Anatomic variants: Complete circle-of-Willis.  Fetal left PCA. Delayed phase: No abnormal intracranial enhancement. Review of the MIP images confirms the above findings IMPRESSION: CT head: 1. No  acute intracranial abnormality identified. 2. ASPECTS is 10. 3. Few nonspecific punctate calcifications within sulci over convexities, probably sequelae of old infectious or inflammatory process. CTA neck: 1. Left subclavian artery origin moderate to severe 70% stenosis with dense calcified plaque. Accurate assessment of luminal stenosis suboptimal due to dense calcification. 2. Right proximal ICA severe greater than 70% stenosis with severe mixed plaque including dense calcification. 3. Left proximal ICA mild to moderate 50% stenosis with severe predominantly calcified plaque. 4. Patent bilateral vertebral arteries. No evidence for dissection or aneurysm. 5. Severe cervical spine degenerative changes. 6. Clustered nodules in right upper lobe and superior segment of right lower lobe probably representing infectious bronchiolitis. The largest nodular opacity measures 15 mm. Non-contrast chest CT at 3-6  months is recommended. If the nodules are stable at time of repeat CT, then future CT at 18-24 months (from today's scan) is considered optional for low-risk patients, but is recommended for high-risk patients. This recommendation follows the consensus statement: Guidelines for Management of Incidental Pulmonary Nodules Detected on CT Images: From the Fleischner Society 2017; Radiology 2017; 284:228-243. CTA head: Patent anterior and posterior intracranial circulation. No large vessel occlusion, aneurysm, or significant stenosis. Electronically Signed   By: Kristine Garbe M.D.   On: 11/10/2017 22:18    EKG: Independently reviewed. NSR  Assessment/Plan  84 y.o. woman with hx of HTN and arthritis who presented with transient L hand weakness. CT imaging thus far notable for atherosclerotic disease with significant stenoses in L subclavian and R carotids. Also may have had hypertension related small lacunar infarct given HTN.   Principal Problem:   TIA (transient ischemic attack) Active Problems:   HTN (hypertension)   # TIA vs small lacunar stroke with L hand weakness. Symptoms self-resolved. R ICA and L subclavian stenosis on CTA head/neck. No large stroke or hemorrhage. May have had undiagnosed TIAs in the past  - neurology recs appreciated, especially regarding need for neurosurg evaluation as inpatient vs outpatient - brain MRI pending - carotid duplex b/l - TTE with bubble study - baby aspirin - start high intensity statin - check A1c and lipids; ESR and CRP. Defer autoimmune work up at this time - discussed with patient re: celebrex risks but patient states that celecoxib is only medicine that works for her arthritis - no need for PT/OT eval at this time  # HTN - will allow permissive HTN SBP goal < 160 - resuming home atenolol 49m nightly - if BP remain > 1887Nsystolics, then add back amlodipine tomorrow  # Hx of peripheral edema, at baseline. Unknown EF although per  patient, no known cardiac hx.  - resume home lasix 267mdaily - TTE as above  # Severe arthritis in hands, shoulders and ankle s/p shoulder and ankle replacement > on celecoxib (home med) - resumed celecoxib 10060mID home dosing - continue to discuss with pt to consider switching to naproxen or other agent that has less cardio/stroke risk  # Glaucoma on eye dorps - resume home dorzolamide-timolol drops b/l    DVT Prophylaxis: lovenox subq Code Status: full code  Family Communication: patient and son  Disposition Plan: inpatient observation and to complete stroke workup   Time spent: > 40 mins  CarColbert EwingD [facility]  If 7PM-7AM, please contact night-coverage [email or web address] Password [email or web address], 10:59 PM

## 2017-11-10 NOTE — ED Provider Notes (Signed)
[facility] EMERGENCY DEPARTMENT Provider Note   CSN: [id number] Arrival date & time: 11/10/17  2018     History   Chief Complaint Chief Complaint  Patient presents with  . Weakness    HPI ROYALE LENNARTZ is a 84 y.o. female.  HPI  84 year old female presents emergency department history of prior breast cancer, hypertension compliant with her medications, had acute onset of left upper extremity weakness described as loss of grip without any paresthesia, paralysis that occurred around 1930 while having dinner.  Patient states having full resolution of symptoms.  Patient denies any headache or additional deficits.  Patient denies any recent trauma or illness.  Patient takes daily aspirin but denies anticoagulation.  Past Medical History:  Diagnosis Date  . Arthritis   . Blood transfusion   . Cancer ([place]) 2000   L breast  . Hypertension     Patient Active Problem List   Diagnosis Date Noted  . History of breast cancer 11/13/2011    Past Surgical History:  Procedure Laterality Date  . ankle replacement surgery    . BREAST SURGERY    . EYE SURGERY  2011   CORRECT WRINKLED RETINA  . EYE SURGERY  07/11/12  . MASTECTOMY, PARTIAL  2000   left side  . MASTECTOMY, PARTIAL  2000   left  . SHOULDER SURGERY     bilateral shoulder replacments  . TOTAL KNEE ARTHROPLASTY     LEFT  . TOTAL KNEE ARTHROPLASTY  11--04-2009   right     OB History    No data available       Home Medications    Prior to Admission medications   Medication Sig Start Date End Date Taking? Authorizing Provider  ALPRAZolam Duanne Moron) 0.25 MG tablet  10/27/11   [provider]  aspirin 81 MG tablet Take 81 mg by mouth daily.      [provider]  ATENOLOL PO Take 50 mg by mouth at bedtime.     [provider]  calcium carbonate (OS-CAL - DOSED IN MG OF ELEMENTAL CALCIUM) 1250 MG tablet Take 1 tablet by mouth 2 (two) times daily.      [provider]  Carboxymethylcellulose Sodium (REFRESH PLUS OP) Apply to eye.    [provider]  Celecoxib (CELEBREX PO) Take 100 mg by mouth 2 (two) times daily. With breakfast and dinner    [provider]  clopidogrel (PLAVIX) 75 MG tablet Take 75 mg by mouth daily.      [provider]  dorzolamide-timolol (COSOPT) 22.3-6.8 MG/ML ophthalmic solution  12/05/12   [provider]  furosemide (LASIX) 40 MG tablet Take 40 mg by mouth daily.      [provider]  glucosamine-chondroitin 500-400 MG tablet Take 1 tablet by mouth 2 (two) times daily. Hold while in hospital     [provider]  loratadine (CLARITIN) 10 MG tablet Take 10 mg by mouth daily.    [provider]  omega-3 acid ethyl esters (LOVAZA) 1 G capsule Take 2 g by mouth 2 (two) times daily.      [provider]  Polyethyl Glycol-Propyl Glycol (SYSTANE OP) Apply to eye.    [provider]  Red Yeast Rice Extract (RED YEAST RICE PO) Take by mouth 2 (two) times daily. Hold while in hospital     [provider]  Sodium Chloride, Diagnostic, 0.9 % SOLN Inject as directed.    [provider]  vitamin B-12 (CYANOCOBALAMIN) 100 MCG tablet Take 50 mcg by mouth daily.      [provider]  vitamin C (ASCORBIC ACID) 500 MG tablet Take 500 mg by mouth daily.      [provider]    Family History Family History  Problem Relation Age of Onset  . Cancer Sister   . Heart disease Sister   . Breast cancer Sister        ? age of onset    Social History Social History   Tobacco Use  . Smoking status: Former Smoker    Last attempt to quit: 07/20/1979    Years since quitting: 38.3  . Smokeless tobacco: Never Used  Substance Use Topics  . Alcohol use: No  . Drug use: No     Allergies   Lisinopril   Review of Systems Review of Systems  Review of Systems  Constitutional: Negative for fever and chills.  HENT: Negative for ear  pain, sore throat and trouble swallowing.   Eyes: Negative for pain and visual disturbance.  Respiratory: Negative for cough and shortness of breath.   Cardiovascular: Negative for chest pain and leg swelling.  Gastrointestinal: Negative for nausea, vomiting, abdominal pain and diarrhea.  Genitourinary: Negative for dysuria, urgency and frequency.  Musculoskeletal: Negative for back pain and joint swelling.  Skin: Negative for rash and wound.  Neurological: see HPI   Physical Exam Updated Vital Signs BP (!) 151/85 (BP Location: Right Arm)   Pulse 76   Temp 98.4 F (36.9 C) (Oral)   Resp 16   Ht 5\' 2"  (1.575 m)   Wt 72.6 kg (160 lb)   SpO2 98%   BMI 29.26 kg/m   Physical Exam   Physical Exam Vitals:   11/10/17 2024  BP: (!) 151/85  Pulse: 76  Resp: 16  Temp: 98.4 F (36.9 C)  SpO2: 98%   Constitutional: Patient is in no acute distress Head: Normocephalic and atraumatic.  Eyes: Extraocular motion intact, no scleral icterus Neck: Supple without meningismus, mass, or overt JVD Respiratory: Effort normal and breath sounds normal. No respiratory distress. CV: Heart regular rate and rhythm, no obvious murmurs.  Pulses +2 and symmetric Abdomen: Soft, non-tender, non-distended MSK: Extremities are atraumatic without deformity, ROM intact Skin: Warm, dry, intact Neuro: Alert and oriented, no motor deficit noted; CN II-XII intact; neg pronator drift; F-->N intact; RAM intact; neg sensory deficit; UE/LE fAROM MS 5/5; neg slurred speech/facial droop  Psychiatric: Mood and affect are normal.   ED Treatments / Results  Labs (all labs ordered are listed, but only abnormal results are displayed) Labs Reviewed  COMPREHENSIVE METABOLIC PANEL - Abnormal; Notable for the following components:      Result Value   Glucose, Bld 111 (*)    BUN 41 (*)    All other components within normal limits  CBG MONITORING, ED - Abnormal; Notable for the following components:   Glucose-Capillary  100 (*)    All other components within normal limits  I-STAT CHEM 8, ED - Abnormal; Notable for the following components:   BUN 39 (*)    Glucose, Bld 104 (*)    All other components within normal limits  PROTIME-INR  APTT  CBC  DIFFERENTIAL  I-STAT TROPONIN, ED    EKG  EKG Interpretation  Date/Time:  Saturday November 10 2017 21:00:21 EST Ventricular Rate:  70 PR Interval:    QRS Duration: 94 QT Interval:  385 QTC Calculation: 416 R Axis:   67  Text Interpretation:  Sinus rhythm Nonspecific T wave abnormality No significant change since last tracing Confirmed by Lajean Saver [phone]) on 11/10/2017 9:12:21 PM       Radiology Ct Angio Head W Or Wo Contrast  Result Date: 11/10/2017 CLINICAL DATA:  84 y/o  F; left hand weakness. EXAM: CT ANGIOGRAPHY HEAD AND NECK TECHNIQUE: Multidetector CT imaging of the head and neck was performed using the standard protocol during bolus administration of intravenous contrast. Multiplanar CT image reconstructions and MIPs were obtained to evaluate the vascular anatomy. Carotid stenosis measurements (when applicable) are obtained utilizing NASCET criteria, using the distal internal carotid diameter as the denominator. CONTRAST:  69mL ISOVUE-370 IOPAMIDOL (ISOVUE-370) INJECTION 76% COMPARISON:  07/20/2011 CT head FINDINGS: CT HEAD FINDINGS Brain: No evidence of acute infarction, hemorrhage, hydrocephalus, extra-axial collection or mass lesion/mass effect. Few nonspecific punctate calcifications are present within sulci over the convexities likely representing sequelae of prior infectious or inflammatory process. Vascular: As below. Skull: Normal. Negative for fracture or focal lesion. Sinuses: Right mastoid tip effusion.  Otherwise negative. Orbits: No acute finding.  Bilateral intra-ocular lens replacement. Review of the MIP images confirms the above findings CTA NECK FINDINGS Aortic arch: Standard branching. Moderate calcific atherosclerosis. Dense calcified  plaque of left subclavian artery origin with moderate to severe approximately 70% stenosis (series 9, image 162), accurate assessment of luminal stenosis suboptimal due to dense calcification. Right carotid system: Severe mixed plaque of the carotid bifurcation with severe greater than 70% proximal ICA stenosis. Left carotid system: Severe predominantly calcified plaque of the carotid bifurcation with mild to moderate 50% proximal ICA stenosis. Vertebral arteries: Right dominant. Left V1 segment partially obscured by photon starvation artifact. No evidence of dissection, stenosis (50% or greater) or occlusion. Skeleton: Severe cervical spondylosis with predominantly discogenic degenerative changes. Grade 1 C2-3 and C7-T1 anterolisthesis. C3-4 interbody fusion. Uncovertebral and facet hypertrophy with bilateral bony foraminal encroachment throughout the cervical spine. Other neck: Subcentimeter thyroid nodules. Upper chest: Clustered nodules in the right upper lobe along the major fissure the largest measuring 15 x 12 mm (series 9, image 186). Additional small cluster of nodules peripherally in the superior segment of right lower lobe (series 9, image 180). Review of the MIP images confirms the above findings CTA HEAD FINDINGS Anterior circulation: No significant stenosis, proximal occlusion, aneurysm, or vascular malformation. Mild calcific atherosclerosis of carotid siphons without significant stenosis. Posterior circulation: No significant stenosis, proximal occlusion, aneurysm, or vascular malformation. Venous sinuses: As permitted by contrast timing, patent. Anatomic variants: Complete circle-of-Willis.  Fetal left PCA. Delayed phase: No abnormal intracranial enhancement. Review of the MIP images confirms the above findings IMPRESSION: CT head: 1. No acute intracranial abnormality identified. 2. ASPECTS is 10. 3. Few nonspecific punctate calcifications within sulci over convexities, probably sequelae of old  infectious or inflammatory process. CTA neck: 1. Left subclavian artery origin moderate to severe 70% stenosis with dense calcified plaque. Accurate assessment of luminal stenosis suboptimal due to dense calcification. 2. Right proximal ICA severe greater than 70% stenosis with severe mixed plaque including dense calcification. 3. Left proximal ICA mild to moderate 50% stenosis with severe predominantly calcified plaque. 4. Patent bilateral vertebral arteries. No evidence for dissection or aneurysm. 5. Severe cervical spine degenerative changes. 6. Clustered nodules in right upper lobe and superior segment of right lower lobe probably representing infectious bronchiolitis. The largest nodular opacity measures 15 mm. Non-contrast chest CT at 3-6 months is recommended. If the nodules are stable at time of repeat CT, then future  CT at 18-24 months (from today's scan) is considered optional for low-risk patients, but is recommended for high-risk patients. This recommendation follows the consensus statement: Guidelines for Management of Incidental Pulmonary Nodules Detected on CT Images: From the Fleischner Society 2017; Radiology 2017; 284:228-243. CTA head: Patent anterior and posterior intracranial circulation. No large vessel occlusion, aneurysm, or significant stenosis. Electronically Signed   By: Kristine Garbe M.D.   On: 11/10/2017 22:18   Ct Angio Neck W Or Wo Contrast  Result Date: 11/10/2017 CLINICAL DATA:  84 y/o  F; left hand weakness. EXAM: CT ANGIOGRAPHY HEAD AND NECK TECHNIQUE: Multidetector CT imaging of the head and neck was performed using the standard protocol during bolus administration of intravenous contrast. Multiplanar CT image reconstructions and MIPs were obtained to evaluate the vascular anatomy. Carotid stenosis measurements (when applicable) are obtained utilizing NASCET criteria, using the distal internal carotid diameter as the denominator. CONTRAST:  48mL ISOVUE-370 IOPAMIDOL  (ISOVUE-370) INJECTION 76% COMPARISON:  07/20/2011 CT head FINDINGS: CT HEAD FINDINGS Brain: No evidence of acute infarction, hemorrhage, hydrocephalus, extra-axial collection or mass lesion/mass effect. Few nonspecific punctate calcifications are present within sulci over the convexities likely representing sequelae of prior infectious or inflammatory process. Vascular: As below. Skull: Normal. Negative for fracture or focal lesion. Sinuses: Right mastoid tip effusion.  Otherwise negative. Orbits: No acute finding.  Bilateral intra-ocular lens replacement. Review of the MIP images confirms the above findings CTA NECK FINDINGS Aortic arch: Standard branching. Moderate calcific atherosclerosis. Dense calcified plaque of left subclavian artery origin with moderate to severe approximately 70% stenosis (series 9, image 162), accurate assessment of luminal stenosis suboptimal due to dense calcification. Right carotid system: Severe mixed plaque of the carotid bifurcation with severe greater than 70% proximal ICA stenosis. Left carotid system: Severe predominantly calcified plaque of the carotid bifurcation with mild to moderate 50% proximal ICA stenosis. Vertebral arteries: Right dominant. Left V1 segment partially obscured by photon starvation artifact. No evidence of dissection, stenosis (50% or greater) or occlusion. Skeleton: Severe cervical spondylosis with predominantly discogenic degenerative changes. Grade 1 C2-3 and C7-T1 anterolisthesis. C3-4 interbody fusion. Uncovertebral and facet hypertrophy with bilateral bony foraminal encroachment throughout the cervical spine. Other neck: Subcentimeter thyroid nodules. Upper chest: Clustered nodules in the right upper lobe along the major fissure the largest measuring 15 x 12 mm (series 9, image 186). Additional small cluster of nodules peripherally in the superior segment of right lower lobe (series 9, image 180). Review of the MIP images confirms the above findings  CTA HEAD FINDINGS Anterior circulation: No significant stenosis, proximal occlusion, aneurysm, or vascular malformation. Mild calcific atherosclerosis of carotid siphons without significant stenosis. Posterior circulation: No significant stenosis, proximal occlusion, aneurysm, or vascular malformation. Venous sinuses: As permitted by contrast timing, patent. Anatomic variants: Complete circle-of-Willis.  Fetal left PCA. Delayed phase: No abnormal intracranial enhancement. Review of the MIP images confirms the above findings IMPRESSION: CT head: 1. No acute intracranial abnormality identified. 2. ASPECTS is 10. 3. Few nonspecific punctate calcifications within sulci over convexities, probably sequelae of old infectious or inflammatory process. CTA neck: 1. Left subclavian artery origin moderate to severe 70% stenosis with dense calcified plaque. Accurate assessment of luminal stenosis suboptimal due to dense calcification. 2. Right proximal ICA severe greater than 70% stenosis with severe mixed plaque including dense calcification. 3. Left proximal ICA mild to moderate 50% stenosis with severe predominantly calcified plaque. 4. Patent bilateral vertebral arteries. No evidence for dissection or aneurysm. 5. Severe cervical spine degenerative changes.  6. Clustered nodules in right upper lobe and superior segment of right lower lobe probably representing infectious bronchiolitis. The largest nodular opacity measures 15 mm. Non-contrast chest CT at 3-6 months is recommended. If the nodules are stable at time of repeat CT, then future CT at 18-24 months (from today's scan) is considered optional for low-risk patients, but is recommended for high-risk patients. This recommendation follows the consensus statement: Guidelines for Management of Incidental Pulmonary Nodules Detected on CT Images: From the Fleischner Society 2017; Radiology 2017; 284:228-243. CTA head: Patent anterior and posterior intracranial circulation. No  large vessel occlusion, aneurysm, or significant stenosis. Electronically Signed   By: Kristine Garbe M.D.   On: 11/10/2017 22:18    Procedures Procedures (including critical care time)  Medications Ordered in ED Medications  iopamidol (ISOVUE-370) 76 % injection (50 mLs  Contrast Given 11/10/17 2140)     Initial Impression / Assessment and Plan / ED Course  I have reviewed the triage vital signs and the nursing notes.  Pertinent labs & imaging results that were available during my care of the patient were reviewed by me and considered in my medical decision making (see chart for details).     84 year old female presents emergency department history of prior breast cancer, hypertension compliant with her medications, had acute onset of left upper extremity weakness described as loss of grip without any paresthesia, paralysis that occurred around 1930 while having dinner.  Patient states having full resolution of symptoms.  Patient denies any headache or additional deficits.  Patient denies any recent trauma or illness.  Patient takes daily aspirin but denies anticoagulation.  Patient arrived here medically stable well-appearing.  Physical exam as annotated above with no evidence of focal deficits.  Neurology is already evaluated the patient and ordered imaging/labs pending.  Review of review of patient's labs shows no evidence of leukocytosis, stable H&H, negative gross electrolyte imbalance, troponin x1-, EKG no findings concerning for ACS/arrhythmia, review of CT angios the head and neck showing moderate stenosis of the left subclavian, right ICA with 70% stenosis in left ICA with mild to moderate 50% stenosis.  CT head with/patent anterior/posterior intracranial circulation.  Plan for admission to hospitalist and recommendations per neurology for inpatient management/evaluation.    Final Clinical Impressions(s) / ED Diagnoses   Final diagnoses:  Weakness    ED Discharge Orders     None       Willette Alma, DO 11/10/17 2319    Lajean Saver, MD 11/11/17 [phone]

## 2017-11-10 NOTE — ED Triage Notes (Signed)
Patient presents to ED for left hand pain/weakness

## 2017-11-10 NOTE — Consult Note (Signed)
Referring Physician: Dr. Hampton Abbot    Chief Complaint: Left hand weakness  HPI: [name] is an 84 y.o. female presenting with sudden onset of left hand weakness starting at [place] while trying to eat dinner. Denies sensory symptoms. Earlier today she had two ocular migraines, which she states she has a long history of. Her hand symptoms began well after the ocular migraines resolved. She has no history of stroke or MI. She takes ASA at home. Plavix is listed on her home medications list in Epic, but not on the sheet of current medications brought from home today.   Denies headache, vision loss, confusion, dysarthria, dysphasia, neck pain, chest pain or abdominal pain. Mild pain to left hand is endorsed.   PMHx includes a remote history of left breast CA s/p surgery.   LSN: 7:30 PM TOSO: 7:30 PM tPA Given: No: Mild symptoms/signs. Benefits significantly outweighed by risk of iatrogenic ICH  Past Medical History:  Diagnosis Date  . Arthritis   . Blood transfusion   . Cancer ([place]) 2000   L breast  . Hypertension     Past Surgical History:  Procedure Laterality Date  . ankle replacement surgery    . BREAST SURGERY    . EYE SURGERY  2011   CORRECT WRINKLED RETINA  . EYE SURGERY  07/11/12  . MASTECTOMY, PARTIAL  2000   left side  . MASTECTOMY, PARTIAL  2000   left  . SHOULDER SURGERY     bilateral shoulder replacments  . TOTAL KNEE ARTHROPLASTY     LEFT  . TOTAL KNEE ARTHROPLASTY  11--04-2009   right     Family History  Problem Relation Age of Onset  . Cancer Sister   . Heart disease Sister   . Breast cancer Sister        ? age of onset   Social History:  reports that she quit smoking about 38 years ago. she has never used smokeless tobacco. She reports that she does not drink alcohol or use drugs.  Allergies:  Allergies  Allergen Reactions  . Lisinopril     Home Medications:    ROS: As per HPI.   Physical Examination: Blood pressure (!) 151/85, pulse 76,  temperature 98.4 F (36.9 C), temperature source Oral, resp. rate 16, height 5\' 2"  (1.575 m), weight 72.6 kg (160 lb), SpO2 98 %.  HEENT: [place]/AT Lungs: Respirations unlabored Ext: Warm and well-perfused  Neurologic Examination: Mental Status: Alert, oriented, thought content appropriate.  Speech fluent without evidence of aphasia.  Able to follow all commands without difficulty. Cranial Nerves: II:  Visual fields intact. Pupils reactive bilaterally; irregular left pupil is chronic per patient III,IV, VI: Chronic mild left ptosis; EOMI without nystagmus V,VII: Smile symmetric, facial temp sensation equal bilaterally VIII: hearing intact to voice IX,X: no hypophonia or hoarseness XI: Symmetric XII: midline tongue extension  Motor: RUE: 5/5 in the context of advanced age LUE: 5/5 deltoid, biceps and triceps. 4+/5 wrist flexion, 4-/5 wrist extension. 4-/5 grip, 3/5 finger abduction and extension.  RLE and LLE: 5/5 hip flexion and knee extension Sensory: Decreased FT and temp sensation to left hand and distal forearm. Otherwise unremarkable.   Deep Tendon Reflexes:  2+ bilateral brachioradialis, biceps and patellae Cerebellar: No ataxia with FNF bilaterally Gait: Deferred  Results for orders placed or performed during the hospital encounter of 11/10/17 (from the past 48 hour(s))  CBC     Status: None   Collection Time: 11/10/17  8:36 PM  Result  Value Ref Range   WBC 5.9 4.0 - 10.5 K/uL   RBC 4.58 3.87 - 5.11 MIL/uL   Hemoglobin 14.0 12.0 - 15.0 g/dL   HCT 41.7 36.0 - 46.0 %   MCV 91.0 78.0 - 100.0 fL   MCH 30.6 26.0 - 34.0 pg   MCHC 33.6 30.0 - 36.0 g/dL   RDW 14.1 11.5 - 15.5 %   Platelets 179 150 - 400 K/uL    Comment: Performed at [facility] [address] [place], [place] [address]  Differential     Status: None   Collection Time: 11/10/17  8:36 PM  Result Value Ref Range   Neutrophils Relative % 62 %   Neutro Abs 3.7 1.7 - 7.7 K/uL   Lymphocytes Relative 26 %    Lymphs Abs 1.5 0.7 - 4.0 K/uL   Monocytes Relative 6 %   Monocytes Absolute 0.4 0.1 - 1.0 K/uL   Eosinophils Relative 5 %   Eosinophils Absolute 0.3 0.0 - 0.7 K/uL   Basophils Relative 1 %   Basophils Absolute 0.0 0.0 - 0.1 K/uL    Comment: Performed at [facility] [address], [place], [place] [address]  CBG monitoring, ED     Status: Abnormal   Collection Time: 11/10/17  8:39 PM  Result Value Ref Range   Glucose-Capillary 100 (H) 65 - 99 mg/dL  I-Stat Chem 8, ED     Status: Abnormal   Collection Time: 11/10/17  8:55 PM  Result Value Ref Range   Sodium 140 135 - 145 mmol/L   Potassium 4.5 3.5 - 5.1 mmol/L   Chloride 103 101 - 111 mmol/L   BUN 39 (H) 6 - 20 mg/dL   Creatinine, Ser 0.80 0.44 - 1.00 mg/dL   Glucose, Bld 104 (H) 65 - 99 mg/dL   Calcium, Ion 1.16 1.15 - 1.40 mmol/L   TCO2 28 22 - 32 mmol/L   Hemoglobin 14.3 12.0 - 15.0 g/dL   HCT 42.0 36.0 - 46.0 %   CT head: 1. No acute intracranial abnormality identified. 2. ASPECTS is 10. 3. Few nonspecific punctate calcifications within sulci over convexities, probably sequelae of old infectious or inflammatory process.  CTA neck: 1. Left subclavian artery origin moderate to severe 70% stenosis with dense calcified plaque. Accurate assessment of luminal stenosis suboptimal due to dense calcification. 2. Right proximal ICA severe greater than 70% stenosis with severe mixed plaque including dense calcification. 3. Left proximal ICA mild to moderate 50% stenosis with severe predominantly calcified plaque. 4. Patent bilateral vertebral arteries. No evidence for dissection or aneurysm. 5. Severe cervical spine degenerative changes.  CTA head: Patent anterior and posterior intracranial circulation. No large vessel occlusion, aneurysm, or significant stenosis.  Assessment: 84 y.o. female with acute onset of left hand weakness with sensory deficit. 1. Most likely secondary to a tiny acute lacunar infarction.    2. Given relatively mild deficit, benefits of tPA are felt to be significantly outweighed by risks. Discussed with patient who states that she does not want treatment with tPA.  3. Not an endovascular candidate due to clinical findings not consistent with LVO. CTA confirms no LVO 4. Stroke Risk Factors - HTN 5. Elevated BUN:Cr suggestive of volume depletion.  Plan: 1. HgbA1c, fasting lipid panel 2. MRI of the brain without contrast 3. PT consult, OT consult, Speech consult 4. Echocardiogram 5. Frequent neuro checks 6. Prophylactic therapy- ASA and Plavix qd 7. Risk factor modification 8. Telemetry monitoring 9.  Given advanced age, benefits of statin treatment most likely outweighed by risks 10. Permissive HTN x 24 hours 11. Gentle IV or PO hydration  @Electronically  signed: Dr. Kerney Elbe  11/10/2017, 9:00 PM

## 2017-11-11 ENCOUNTER — Other Ambulatory Visit (HOSPITAL_COMMUNITY): Payer: Medicare Other

## 2017-11-11 ENCOUNTER — Observation Stay (HOSPITAL_COMMUNITY): Payer: Medicare Other

## 2017-11-11 ENCOUNTER — Observation Stay (HOSPITAL_BASED_OUTPATIENT_CLINIC_OR_DEPARTMENT_OTHER): Payer: Medicare Other

## 2017-11-11 DIAGNOSIS — G459 Transient cerebral ischemic attack, unspecified: Secondary | ICD-10-CM

## 2017-11-11 DIAGNOSIS — I6529 Occlusion and stenosis of unspecified carotid artery: Secondary | ICD-10-CM

## 2017-11-11 DIAGNOSIS — R531 Weakness: Secondary | ICD-10-CM | POA: Diagnosis not present

## 2017-11-11 DIAGNOSIS — I639 Cerebral infarction, unspecified: Secondary | ICD-10-CM | POA: Diagnosis not present

## 2017-11-11 DIAGNOSIS — I6523 Occlusion and stenosis of bilateral carotid arteries: Secondary | ICD-10-CM | POA: Diagnosis not present

## 2017-11-11 DIAGNOSIS — I10 Essential (primary) hypertension: Secondary | ICD-10-CM

## 2017-11-11 DIAGNOSIS — I6789 Other cerebrovascular disease: Secondary | ICD-10-CM | POA: Diagnosis not present

## 2017-11-11 LAB — BASIC METABOLIC PANEL
Anion gap: 12 (ref 5–15)
BUN: 34 mg/dL — AB (ref 6–20)
CHLORIDE: 104 mmol/L (ref 101–111)
CO2: 22 mmol/L (ref 22–32)
CREATININE: 0.72 mg/dL (ref 0.44–1.00)
Calcium: 9.1 mg/dL (ref 8.9–10.3)
GFR calc Af Amer: >60 mL/min (ref >60)
GFR calc non Af Amer: >60 mL/min (ref >60)
GLUCOSE: 110 mg/dL — AB (ref 65–99)
Potassium: 3.7 mmol/L (ref 3.5–5.1)
SODIUM: 138 mmol/L (ref 135–145)

## 2017-11-11 LAB — CBC
HCT: 41.8 % (ref 36.0–46.0)
Hemoglobin: 13.4 g/dL (ref 12.0–15.0)
MCH: 29.3 pg (ref 26.0–34.0)
MCHC: 32.1 g/dL (ref 30.0–36.0)
MCV: 91.5 fL (ref 78.0–100.0)
PLATELETS: 162 10*3/uL (ref 150–400)
RBC: 4.57 MIL/uL (ref 3.87–5.11)
RDW: 14.4 % (ref 11.5–15.5)
WBC: 5.8 10*3/uL (ref 4.0–10.5)

## 2017-11-11 LAB — C-REACTIVE PROTEIN: CRP: <0.8 mg/dL (ref <1.0)

## 2017-11-11 LAB — SEDIMENTATION RATE: Sed Rate: 12 mm/hr (ref 0–22)

## 2017-11-11 LAB — LIPID PANEL
CHOL/HDL RATIO: 2.9 ratio
Cholesterol: 184 mg/dL (ref 0–200)
HDL: 64 mg/dL (ref >40)
LDL CALC: 103 mg/dL — AB (ref 0–99)
TRIGLYCERIDES: 83 mg/dL (ref <150)
VLDL: 17 mg/dL (ref 0–40)

## 2017-11-11 LAB — HEMOGLOBIN A1C
HEMOGLOBIN A1C: 5.1 % (ref 4.8–5.6)
Mean Plasma Glucose: 99.67 mg/dL

## 2017-11-11 MED ORDER — ASPIRIN 325 MG PO TBEC: 325.0000 mg | DELAYED_RELEASE_TABLET | Freq: Every day | ORAL | 0 refills | Status: DC

## 2017-11-11 MED ORDER — ATORVASTATIN CALCIUM 80 MG PO TABS: 80.0000 mg | ORAL_TABLET | Freq: Every day | ORAL | Status: DC

## 2017-11-11 MED ORDER — ATORVASTATIN CALCIUM 80 MG PO TABS: 80.0000 mg | ORAL_TABLET | Freq: Every day | ORAL | 0 refills | Status: AC

## 2017-11-11 MED ORDER — ASPIRIN 81 MG PO TABS: 81.0000 mg | ORAL_TABLET | Freq: Every day | ORAL | Status: DC

## 2017-11-11 MED ORDER — CELECOXIB 100 MG PO CAPS: 100.0000 mg | ORAL_CAPSULE | Freq: Two times a day (BID) | ORAL | Status: DC

## 2017-11-11 MED ORDER — FUROSEMIDE 20 MG PO TABS: 20.0000 mg | ORAL_TABLET | Freq: Every day | ORAL | Status: DC

## 2017-11-11 MED ORDER — ASPIRIN EC 325 MG PO TBEC: 325.0000 mg | DELAYED_RELEASE_TABLET | Freq: Every day | ORAL | Status: DC

## 2017-11-11 MED ORDER — ASPIRIN EC 81 MG PO TBEC
81.0000 mg | DELAYED_RELEASE_TABLET | Freq: Every day | ORAL | Status: DC
  Administered 2017-11-11: 81 mg via ORAL
  Filled 2017-11-11: qty 1

## 2017-11-11 MED ORDER — FUROSEMIDE 20 MG PO TABS: 40.0000 mg | ORAL_TABLET | Freq: Every day | ORAL | Status: DC

## 2017-11-11 MED ORDER — ATENOLOL 25 MG PO TABS: 50.0000 mg | ORAL_TABLET | Freq: Every day | ORAL | Status: DC

## 2017-11-11 MED ORDER — CELECOXIB 100 MG PO CAPS
100.0000 mg | ORAL_CAPSULE | Freq: Two times a day (BID) | ORAL | Status: DC
  Administered 2017-11-11: 100 mg via ORAL
  Filled 2017-11-11 (×2): qty 1

## 2017-11-11 MED ORDER — FUROSEMIDE 20 MG PO TABS
20.0000 mg | ORAL_TABLET | Freq: Every day | ORAL | Status: DC
  Administered 2017-11-11: 20 mg via ORAL
  Filled 2017-11-11: qty 1

## 2017-11-11 MED ORDER — CLOPIDOGREL BISULFATE 75 MG PO TABS: 75.0000 mg | ORAL_TABLET | Freq: Every day | ORAL | 11 refills | Status: DC

## 2017-11-11 NOTE — Evaluation (Signed)
Physical Therapy Evaluation Patient Details Name: [name] MRN: [id number] DOB: 1931/05/04 Today's Date: 11/11/2017   History of Present Illness  84 y.o. female with hx of L breast CA (remote) s/p mastectomy, arthritis, HTN and known R ICA stenosis who presented with transient L hand grip weakness. MRI negative.     Clinical Impression  PT eval complete. Pt is modified independent with all functional mobility. See below for further details. Pt educated on BEFAST for stroke recognition. Pt reports LUE weakness/numbness has fully resolved. No further PT intervention indicated. PT signing off.     Follow Up Recommendations No PT follow up;Supervision - Intermittent    Equipment Recommendations  None recommended by PT    Recommendations for Other Services       Precautions / Restrictions Precautions Precautions: None      Mobility  Bed Mobility Overal bed mobility: Modified Independent                Transfers Overall transfer level: Modified independent Equipment used: Straight cane             General transfer comment: increased time to stabilize initial standing balance  Ambulation/Gait Ambulation/Gait assistance: Modified independent (Device/Increase time) Ambulation Distance (Feet): 280 Feet Assistive device: Straight cane Gait Pattern/deviations: WFL(Within Functional Limits) Gait velocity: WFL Gait velocity interpretation: >2.62 ft/sec, indicative of independent community ambulator General Gait Details: steady gait  Stairs Stairs: Yes Stairs assistance: Modified independent (Device/Increase time) Stair Management: Forwards;Two rails Number of Stairs: 5    Wheelchair Mobility    Modified Rankin (Stroke Patients Only)       Balance Overall balance assessment: Mild deficits observed, not formally tested                                           Pertinent Vitals/Pain Pain Assessment: No/denies pain    Home Living  Family/patient expects to be discharged to:: Private residence Living Arrangements: Alone Available Help at Discharge: Friend(s);Family;Available PRN/intermittently Type of Home: House Home Access: Stairs to enter Entrance Stairs-Rails: Right Entrance Stairs-Number of Steps: 3 Home Layout: One level Home Equipment: Cane - single point      Prior Function Level of Independence: Independent with assistive device(s)         Comments: cane for ambulation outside the house     Hand Dominance        Extremity/Trunk Assessment   Upper Extremity Assessment Upper Extremity Assessment: Overall WFL for tasks assessed(symmetrical)    Lower Extremity Assessment Lower Extremity Assessment: Overall WFL for tasks assessed(symmetrical)    Cervical / Trunk Assessment Cervical / Trunk Assessment: Kyphotic  Communication   Communication: No difficulties  Cognition Arousal/Alertness: Awake/alert Behavior During Therapy: WFL for tasks assessed/performed Overall Cognitive Status: Within Functional Limits for tasks assessed                                        General Comments      Exercises     Assessment/Plan    PT Assessment Patent does not need any further PT services  PT Problem List         PT Treatment Interventions      PT Goals (Current goals can be found in the Care Plan section)  Acute Rehab PT Goals Patient Stated Goal:  home PT Goal Formulation: All assessment and education complete, DC therapy    Frequency     Barriers to discharge        Co-evaluation               AM-PAC PT "6 Clicks" Daily Activity  Outcome Measure Difficulty turning over in bed (including adjusting bedclothes, sheets and blankets)?: None Difficulty moving from lying on back to sitting on the side of the bed? : None Difficulty sitting down on and standing up from a chair with arms (e.g., wheelchair, bedside commode, etc,.)?: A Little Help needed moving to  and from a bed to chair (including a wheelchair)?: None Help needed walking in hospital room?: None Help needed climbing 3-5 steps with a railing? : None 6 Click Score: 23    End of Session Equipment Utilized During Treatment: Gait belt Activity Tolerance: Patient tolerated treatment well Patient left: in bed;with call bell/phone within reach Nurse Communication: Mobility status PT Visit Diagnosis: Difficulty in walking, not elsewhere classified (R26.2)    Time: 8882-8003 PT Time Calculation (min) (ACUTE ONLY): 15 min   Charges:   PT Evaluation $PT Eval Low Complexity: 1 Low     PT G Codes:        Lorrin Goodell, PT  Office # [phone] Pager [phone]   Lorriane Shire 11/11/2017, 9:27 AM

## 2017-11-11 NOTE — Progress Notes (Signed)
STROKE TEAM PROGRESS NOTE   HISTORY OF PRESENT ILLNESS (per record) [name] is an 84 y.o. female presenting with sudden onset of left hand weakness starting at [place] while trying to eat dinner. Denies sensory symptoms. Earlier today she had two ocular migraines, which she states she has a long history of. Her hand symptoms began well after the ocular migraines resolved. She has no history of stroke or MI. She takes ASA at home. Plavix is listed on her home medications list in Epic, but not on the sheet of current medications brought from home today.   Denies headache, vision loss, confusion, dysarthria, dysphasia, neck pain, chest pain or abdominal pain. Mild pain to left hand is endorsed.   PMHx includes a remote history of left breast CA s/p surgery.   LSN: 7:30 PM TOSO: 7:30 PM tPA Given: No: Mild symptoms/signs. Benefits significantly outweighed by risk of iatrogenic ICH     SUBJECTIVE (INTERVAL HISTORY) No family members present. The patient feels she is about back to baseline. She does note leg cramps which have been a problem in the past.She takes lasix but is not on supplemental potassium    OBJECTIVE Temp:  [97.6 F (36.4 C)-98.4 F (36.9 C)] 97.8 F (36.6 C) (03/03 0800) Pulse Rate:  [69-88] 80 (03/03 0800) Cardiac Rhythm: Normal sinus rhythm (03/03 0250) Resp:  [15-18] 18 (03/03 0238) BP: (131-156)/(48-96) 154/64 (03/03 0800) SpO2:  [93 %-99 %] 96 % (03/03 0800) Weight:  [160 lb (72.6 kg)] 160 lb (72.6 kg) (03/02 2024)  CBC:  Recent Labs  Lab 11/10/17 2036 11/10/17 2055 11/11/17 0251  WBC 5.9  --  5.8  NEUTROABS 3.7  --   --   HGB 14.0 14.3 13.4  HCT 41.7 42.0 41.8  MCV 91.0  --  91.5  PLT 179  --  993    Basic Metabolic Panel:  Recent Labs  Lab 11/10/17 2036 11/10/17 2055 11/11/17 0251  NA 139 140 138  K 4.7 4.5 3.7  CL 103 103 104  CO2 25  --  22  GLUCOSE 111* 104* 110*  BUN 41* 39* 34*  CREATININE 0.80 0.80 0.72  CALCIUM 9.4  --   9.1    Lipid Panel:     Component Value Date/Time   CHOL 184 11/11/2017 0251   TRIG 83 11/11/2017 0251   HDL 64 11/11/2017 0251   CHOLHDL 2.9 11/11/2017 0251   VLDL 17 11/11/2017 0251   LDLCALC 103 (H) 11/11/2017 0251   HgbA1c:  Lab Results  Component Value Date   HGBA1C 5.1 11/11/2017   Urine Drug Screen: No results found for: LABOPIA, COCAINSCRNUR, LABBENZ, AMPHETMU, THCU, LABBARB  Alcohol Level No results found for: ETH  IMAGING   Ct Angio Head W Or Wo Contrast 11/10/2017 IMPRESSION:   CT head:  1. No acute intracranial abnormality identified.  2. ASPECTS is 10.  3. Few nonspecific punctate calcifications within sulci over convexities, probably sequelae of old infectious or inflammatory process.   CTA neck:  1. Left subclavian artery origin moderate to severe 70% stenosis with dense calcified plaque. Accurate assessment of luminal stenosis suboptimal due to dense calcification.  2. Right proximal ICA severe greater than 70% stenosis with severe mixed plaque including dense calcification.  3. Left proximal ICA mild to moderate 50% stenosis with severe predominantly calcified plaque.  4. Patent bilateral vertebral arteries. No evidence for dissection or aneurysm.  5. Severe cervical spine degenerative changes.  6. Clustered nodules in right upper lobe  and superior segment of right lower lobe probably representing infectious bronchiolitis. The largest nodular opacity measures 15 mm. Non-contrast chest CT at 3-6 months is recommended. If the nodules are stable at time of repeat CT, then future CT at 18-24 months (from today's scan) is considered optional for low-risk patients, but is recommended for high-risk patients.     Mr Brain Wo Contrast 11/11/2017 IMPRESSION:  1. No acute intracranial abnormality identified.  2. Mild chronic microvascular ischemic changes and mild parenchymal volume loss of the brain.     Transthoracic Echocardiogram - pending   Bilateral  Carotid Dopplers - pending        PHYSICAL EXAM Vitals:   11/11/17 0238 11/11/17 0454 11/11/17 0652 11/11/17 0800  BP: (!) 146/59 (!) 131/48 (!) 137/57 (!) 154/64  Pulse: 88 82 76 80  Resp: 18     Temp: 97.6 F (36.4 C)  97.7 F (36.5 C) 97.8 F (36.6 C)  TempSrc: Oral  Oral Oral  SpO2: 99%  93% 96%  Weight:      Height:       Pleasant frail elderly Caucasian lady currently not in distress. . Afebrile. Head is nontraumatic. Neck is supple without bruit.    Cardiac exam no murmur or gallop. Lungs are clear to auscultation. Distal pulses are well felt.  Neurological Exam ;  Awake  Alert oriented x 3. Normal speech and language.eye movements full without nystagmus.fundi were not visualized. Vision acuity and fields appear normal. Hearing is normal. Palatal movements are normal. Face symmetric. Tongue midline. Normal strength, tone, reflexes and coordination. Normal sensation. Gait deferred.    ASSESSMENT/PLAN [name] is a 84 y.o. female with history of migraine headaches, hypertension, and breast cancer  presenting with left sided weakness. She did not receive IV t-PA due to minimal deficits.   Possible Rt hemispheric  TIA    Resultant no deficits  CT head - No acute intracranial abnormality identified.   MRI head - No acute intracranial abnormality identified.   MRA head - not performed  CTA H&N - Right proximal ICA severe greater than 70% stenosis  Carotid Doppler - CTA neck  2D Echo - pending  LDL - 103  HgbA1c - 5.1  VTE prophylaxis -  - Lovenox Fall precautions Diet Heart Room service appropriate? Yes; Fluid consistency: Thin  aspirin 81 mg daily prior to admission, now on aspirin 81 mg daily  Patient counseled to be compliant with her antithrombotic medications  Ongoing aggressive stroke risk factor management  Therapy recommendations:  No PT follow-up recommended.  Disposition:  Pending  Hypertension  Stable  Permissive  hypertension (OK if < 220/120) but gradually normalize in 5-7 days  Long-term BP goal normotensive  Hyperlipidemia  Home meds:  No lipid lowering medications prior to admission  LDL 103, goal < 70  Now on Lipitor 80 mg daily  Continue statin at discharge    Other Stroke Risk Factors  Advanced age  Former cigarette smoker - quit 38 years ago  Obesity, Body mass index is 29.26 kg/m., recommend weight loss, diet and exercise as appropriate   Migraines    Other Active Problems  Clustered nodules in right upper lobe and superior segment of right lower lobe probably representing infectious bronchiolitis. The largest nodular opacity measures 15 mm. Non-contrast chest CT at 3-6 months is recommended. If the nodules are stable at time of repeat CT, then future CT at 18-24 months (from today's scan) is considered optional for low-risk patients,  but is recommended for high-risk patients.   Mildly elevated BUN - (on Lasix and Celebrex)  Leg cramps - check magnesium level  Rt proximal ICA severe greater than 70% stenosis - needs vascular surgery consult.  The patient apparently is hesitant about taking Plavix. Will increase aspirin to 325 mg daily.   Plan / Recommendations   Stroke workup - await 2-D echo and carotid Dopplers  Vascular surgery consult for elective right carotid revascularization.  Change aspirin to Plavix for stroke prevention.   Hospital day # 0  Mikey Bussing PA-C [facility] Pager [phone] 11/11/2017, 2:29 PM I have personally examined this patient, reviewed notes, independently viewed imaging studies, participated in medical decision making and plan of care.ROS completed by me personally and pertinent positives fully documented  I have made any additions or clarifications directly to the above note. Agree with note above.  She presented with transient left hand weakness of sudden onset without any accompanying pain for an hour and a  half and has made full recovery.  MRI scan shows no definite stroke but stroke workup is pending.  This likely represents a right hemispheric TIA symptomatic from proximal right carotid stenosis and she needs vascular surgery evaluation with plans for early elective right carotid endarterectomy in the next 1-2 weeks.  Discussed with patient and answered questions.  Greater than 50% time during this 35-minute visit was spent on counseling and coordination of care about her TIA, symptomatic carotid stenosis and answering questions Antony Contras, Lydia Pager: [phone] 11/11/2017 2:35 PM   To contact Stroke Continuity provider, please refer to [email or web address] After hours, contact General Neurology

## 2017-11-11 NOTE — CV Procedure (Signed)
Attempted 2D Echo, patient said they will have the procedure later as an outpatient.  Cardell Peach

## 2017-11-11 NOTE — Progress Notes (Addendum)
   Daily Progress Note  Pt not in room.  Pt's CTA demonstrates a R ICA stenosis >90% with heterogenous plaque.  In my opinion this lesion may be the etiology for this patient's TIA.   I agree double anti-platelet reasonable: ASA & Plavix  Will try to get patient on schedule for R CEA this Thurs/Friday.  Please hold this patient's discharge until I have seen her to coordinate these efforts.   [name], MD, FACS [facility] Office: [phone] Pager: [phone]  11/11/2017, 5:03 PM

## 2017-11-11 NOTE — Discharge Summary (Signed)
Physician Discharge Summary  [name] NTI:[id number] DOB: Feb 03, 1931 DOA: 11/10/2017  PCP: Shon Baton, MD  Admit date: 11/10/2017 Discharge date: 11/11/2017  Admitted From: Home Disposition: Home  Recommendations for Outpatient Follow-up:  1. Follow up with PCP in 1 week 2. Follow-up with neurology/Dr. Leonie Man 3. Follow-up with vascular surgery/Dr. Bridgett Larsson at earliest [place]: No Equipment/Devices: None  Discharge Condition: Stable CODE STATUS: Full Diet recommendation: Heart Healthy   Brief/Interim Summary: 84 year old female with history of left breast cancer status post mastectomy, arthritis, hypertension and known right ICA stenosis presented with transient left hand grip weakness on 11/10/2017.  Neurology was consulted.  Patient's symptoms have resolved.  Neurology recommended Plavix but patient was hesitant to take Plavix.  She will be discharged on aspirin 325 mg daily.  Patient will need outpatient vascular surgery evaluation at earliest convenience for evaluation of carotid stenosis.  Patient will be discharged home once 2D echo is completed.  Neurology has cleared the patient for discharge.    Discharge Diagnoses:  Principal Problem:   TIA (transient ischemic attack) Active Problems:   HTN (hypertension)   Carotid stenosis  TIA -Presenting with left  hand weakness. Symptoms self-resolved. -Neurology following: recommended Plavix but patient was hesitant to take Plavix.  She will be discharged on aspirin 325 mg daily along with Lipitor 80 mg daily. -MRI brain is negative for acute stroke -2D echo pending. -Patient will be discharged home once 2D echo is completed.  Neurology has cleared the patient for discharge. -Patient has tolerated physical therapy and diet.  R ICA and L subclavian stenosis on CTA head/neck.  -I spoke to Dr. Magnus Ivan surgery on phone who recommended that patient can be discharged home and outpatient evaluation and follow-up  will be arranged by vascular surgery.  HTN -Blood pressure stable. Continue home atenolol and Lasix  Hx of peripheral edema, at baseline. Unknown EF although per patient, no known cardiac hx.  - resume home lasix 20mg  daily -Echo pending.  This can be followed up by primary care provider  Severe arthritis in hands, shoulders and ankle s/p shoulder and ankle replacement -Continue celecoxib  home dosing - continue to discuss with patient to consider switching to naproxen or other agent that has less cardio/stroke risk  Glaucoma  -Continue home eyedrops   Discharge Instructions  Discharge Instructions    Ambulatory referral to Neurology   Complete by:  As directed    An appointment is requested in approximately: few weeks; TIA followup   Ambulatory referral to Vascular Surgery   Complete by:  As directed    Carotid stenosis; Recent TIA   Call MD for:  difficulty breathing, headache or visual disturbances   Complete by:  As directed    Call MD for:  extreme fatigue   Complete by:  As directed    Call MD for:  hives   Complete by:  As directed    Call MD for:  persistant dizziness or light-headedness   Complete by:  As directed    Call MD for:  persistant nausea and vomiting   Complete by:  As directed    Call MD for:  severe uncontrolled pain   Complete by:  As directed    Call MD for:  temperature >100.4   Complete by:  As directed    Diet - low sodium heart healthy   Complete by:  As directed    Increase activity slowly   Complete by:  As directed  Allergies as of 11/11/2017      Reactions   Lisinopril Cough      Medication List    STOP taking these medications   aspirin 81 MG tablet Replaced by:  aspirin 325 MG EC tablet     TAKE these medications   alendronate 70 MG tablet Commonly known as:  FOSAMAX Take 70 mg by mouth every 14 (fourteen) days. Take with a full glass of water on an empty stomach.   ALPRAZolam 0.25 MG tablet Commonly known as:   XANAX Take 0.125 mg by mouth at bedtime.   amLODipine 2.5 MG tablet Commonly known as:  NORVASC Take 2.5 mg by mouth daily.   aspirin 325 MG EC tablet Take 1 tablet (325 mg total) by mouth daily. Start taking on:  11/12/2017 Replaces:  aspirin 81 MG tablet   atenolol 50 MG tablet Commonly known as:  TENORMIN Take 50 mg by mouth daily.   atorvastatin 80 MG tablet Commonly known as:  LIPITOR Take 1 tablet (80 mg total) by mouth daily at 6 PM.   calcium carbonate 1250 (500 Ca) MG tablet Commonly known as:  OS-CAL - dosed in mg of elemental calcium Take 1 tablet by mouth 2 (two) times daily.   celecoxib 100 MG capsule Commonly known as:  CELEBREX Take 100 mg by mouth 2 (two) times daily.   cholecalciferol 400 units Tabs tablet Commonly known as:  VITAMIN D Take 400 Units by mouth 2 (two) times daily.   DORZOLAMIDE HCL OP Place 1 drop into the left eye 2 (two) times daily.   furosemide 20 MG tablet Commonly known as:  LASIX Take 20 mg by mouth daily.   Glucosamine 750 MG Tabs Take 750 mg by mouth 2 (two) times daily.   glucosamine-chondroitin 500-400 MG tablet Take 1 tablet by mouth 2 (two) times daily. Hold while in hospital   Lutein 6 MG Tabs Take 6 mg by mouth daily.   multivitamin tablet Take 1 tablet by mouth daily.   omega-3 acid ethyl esters 1 g capsule Commonly known as:  LOVAZA Take 1 g by mouth 2 (two) times daily.   Red Yeast Rice 600 MG Tabs Take 600 mg by mouth daily.   REFRESH PLUS OP Place 1 drop into both eyes 4 (four) times daily. My use additional drops as needed for dry eyes   sodium chloride 5 % ophthalmic solution Commonly known as:  MURO 128 Place 1 drop into both eyes 2 (two) times daily.   SYSTANE OP Place 1 drop into both eyes 2 (two) times daily.   Turmeric Curcumin 500 MG Caps Take 500 mg by mouth daily.   vitamin B-12 100 MCG tablet Commonly known as:  CYANOCOBALAMIN Take 100 mcg by mouth daily.   vitamin C 1000 MG  tablet Take 1,000 mg by mouth daily.      Follow-up Information    Shon Baton, MD. Schedule an appointment as soon as possible for a visit in 1 week(s).   Specialty:  Internal Medicine Contact information: [place] [address] [phone]        Conrad [place], MD. Schedule an appointment as soon as possible for a visit in 1 week(s).   Specialties:  Vascular Surgery, Cardiology Contact information: [address] [place]  [address] [phone]          Allergies  Allergen Reactions  . Lisinopril Cough    Consultations:  Neurology  Vascular surgery/Dr. Bridgett Larsson on phone  Procedures/Studies: Ct Angio Head W Or Wo Contrast  Result Date: 11/10/2017 CLINICAL DATA:  84 y/o  F; left hand weakness. EXAM: CT ANGIOGRAPHY HEAD AND NECK TECHNIQUE: Multidetector CT imaging of the head and neck was performed using the standard protocol during bolus administration of intravenous contrast. Multiplanar CT image reconstructions and MIPs were obtained to evaluate the vascular anatomy. Carotid stenosis measurements (when applicable) are obtained utilizing NASCET criteria, using the distal internal carotid diameter as the denominator. CONTRAST:  70mL ISOVUE-370 IOPAMIDOL (ISOVUE-370) INJECTION 76% COMPARISON:  07/20/2011 CT head FINDINGS: CT HEAD FINDINGS Brain: No evidence of acute infarction, hemorrhage, hydrocephalus, extra-axial collection or mass lesion/mass effect. Few nonspecific punctate calcifications are present within sulci over the convexities likely representing sequelae of prior infectious or inflammatory process. Vascular: As below. Skull: Normal. Negative for fracture or focal lesion. Sinuses: Right mastoid tip effusion.  Otherwise negative. Orbits: No acute finding.  Bilateral intra-ocular lens replacement. Review of the MIP images confirms the above findings CTA NECK FINDINGS Aortic arch: Standard branching. Moderate calcific atherosclerosis. Dense calcified  plaque of left subclavian artery origin with moderate to severe approximately 70% stenosis (series 9, image 162), accurate assessment of luminal stenosis suboptimal due to dense calcification. Right carotid system: Severe mixed plaque of the carotid bifurcation with severe greater than 70% proximal ICA stenosis. Left carotid system: Severe predominantly calcified plaque of the carotid bifurcation with mild to moderate 50% proximal ICA stenosis. Vertebral arteries: Right dominant. Left V1 segment partially obscured by photon starvation artifact. No evidence of dissection, stenosis (50% or greater) or occlusion. Skeleton: Severe cervical spondylosis with predominantly discogenic degenerative changes. Grade 1 C2-3 and C7-T1 anterolisthesis. C3-4 interbody fusion. Uncovertebral and facet hypertrophy with bilateral bony foraminal encroachment throughout the cervical spine. Other neck: Subcentimeter thyroid nodules. Upper chest: Clustered nodules in the right upper lobe along the major fissure the largest measuring 15 x 12 mm (series 9, image 186). Additional small cluster of nodules peripherally in the superior segment of right lower lobe (series 9, image 180). Review of the MIP images confirms the above findings CTA HEAD FINDINGS Anterior circulation: No significant stenosis, proximal occlusion, aneurysm, or vascular malformation. Mild calcific atherosclerosis of carotid siphons without significant stenosis. Posterior circulation: No significant stenosis, proximal occlusion, aneurysm, or vascular malformation. Venous sinuses: As permitted by contrast timing, patent. Anatomic variants: Complete circle-of-Willis.  Fetal left PCA. Delayed phase: No abnormal intracranial enhancement. Review of the MIP images confirms the above findings IMPRESSION: CT head: 1. No acute intracranial abnormality identified. 2. ASPECTS is 10. 3. Few nonspecific punctate calcifications within sulci over convexities, probably sequelae of old  infectious or inflammatory process. CTA neck: 1. Left subclavian artery origin moderate to severe 70% stenosis with dense calcified plaque. Accurate assessment of luminal stenosis suboptimal due to dense calcification. 2. Right proximal ICA severe greater than 70% stenosis with severe mixed plaque including dense calcification. 3. Left proximal ICA mild to moderate 50% stenosis with severe predominantly calcified plaque. 4. Patent bilateral vertebral arteries. No evidence for dissection or aneurysm. 5. Severe cervical spine degenerative changes. 6. Clustered nodules in right upper lobe and superior segment of right lower lobe probably representing infectious bronchiolitis. The largest nodular opacity measures 15 mm. Non-contrast chest CT at 3-6 months is recommended. If the nodules are stable at time of repeat CT, then future CT at 18-24 months (from today's scan) is considered optional for low-risk patients, but is recommended for high-risk patients. This recommendation follows the consensus statement: Guidelines for Management of Incidental  Pulmonary Nodules Detected on CT Images: From the Fleischner Society 2017; Radiology 2017; [id number]. CTA head: Patent anterior and posterior intracranial circulation. No large vessel occlusion, aneurysm, or significant stenosis. Electronically Signed   By: Kristine Garbe M.D.   On: 11/10/2017 22:18   Ct Angio Neck W Or Wo Contrast  Result Date: 11/10/2017 CLINICAL DATA:  84 y/o  F; left hand weakness. EXAM: CT ANGIOGRAPHY HEAD AND NECK TECHNIQUE: Multidetector CT imaging of the head and neck was performed using the standard protocol during bolus administration of intravenous contrast. Multiplanar CT image reconstructions and MIPs were obtained to evaluate the vascular anatomy. Carotid stenosis measurements (when applicable) are obtained utilizing NASCET criteria, using the distal internal carotid diameter as the denominator. CONTRAST:  41mL ISOVUE-370 IOPAMIDOL  (ISOVUE-370) INJECTION 76% COMPARISON:  07/20/2011 CT head FINDINGS: CT HEAD FINDINGS Brain: No evidence of acute infarction, hemorrhage, hydrocephalus, extra-axial collection or mass lesion/mass effect. Few nonspecific punctate calcifications are present within sulci over the convexities likely representing sequelae of prior infectious or inflammatory process. Vascular: As below. Skull: Normal. Negative for fracture or focal lesion. Sinuses: Right mastoid tip effusion.  Otherwise negative. Orbits: No acute finding.  Bilateral intra-ocular lens replacement. Review of the MIP images confirms the above findings CTA NECK FINDINGS Aortic arch: Standard branching. Moderate calcific atherosclerosis. Dense calcified plaque of left subclavian artery origin with moderate to severe approximately 70% stenosis (series 9, image 162), accurate assessment of luminal stenosis suboptimal due to dense calcification. Right carotid system: Severe mixed plaque of the carotid bifurcation with severe greater than 70% proximal ICA stenosis. Left carotid system: Severe predominantly calcified plaque of the carotid bifurcation with mild to moderate 50% proximal ICA stenosis. Vertebral arteries: Right dominant. Left V1 segment partially obscured by photon starvation artifact. No evidence of dissection, stenosis (50% or greater) or occlusion. Skeleton: Severe cervical spondylosis with predominantly discogenic degenerative changes. Grade 1 C2-3 and C7-T1 anterolisthesis. C3-4 interbody fusion. Uncovertebral and facet hypertrophy with bilateral bony foraminal encroachment throughout the cervical spine. Other neck: Subcentimeter thyroid nodules. Upper chest: Clustered nodules in the right upper lobe along the major fissure the largest measuring 15 x 12 mm (series 9, image 186). Additional small cluster of nodules peripherally in the superior segment of right lower lobe (series 9, image 180). Review of the MIP images confirms the above findings  CTA HEAD FINDINGS Anterior circulation: No significant stenosis, proximal occlusion, aneurysm, or vascular malformation. Mild calcific atherosclerosis of carotid siphons without significant stenosis. Posterior circulation: No significant stenosis, proximal occlusion, aneurysm, or vascular malformation. Venous sinuses: As permitted by contrast timing, patent. Anatomic variants: Complete circle-of-Willis.  Fetal left PCA. Delayed phase: No abnormal intracranial enhancement. Review of the MIP images confirms the above findings IMPRESSION: CT head: 1. No acute intracranial abnormality identified. 2. ASPECTS is 10. 3. Few nonspecific punctate calcifications within sulci over convexities, probably sequelae of old infectious or inflammatory process. CTA neck: 1. Left subclavian artery origin moderate to severe 70% stenosis with dense calcified plaque. Accurate assessment of luminal stenosis suboptimal due to dense calcification. 2. Right proximal ICA severe greater than 70% stenosis with severe mixed plaque including dense calcification. 3. Left proximal ICA mild to moderate 50% stenosis with severe predominantly calcified plaque. 4. Patent bilateral vertebral arteries. No evidence for dissection or aneurysm. 5. Severe cervical spine degenerative changes. 6. Clustered nodules in right upper lobe and superior segment of right lower lobe probably representing infectious bronchiolitis. The largest nodular opacity measures 15 mm. Non-contrast chest CT at 3-6  months is recommended. If the nodules are stable at time of repeat CT, then future CT at 18-24 months (from today's scan) is considered optional for low-risk patients, but is recommended for high-risk patients. This recommendation follows the consensus statement: Guidelines for Management of Incidental Pulmonary Nodules Detected on CT Images: From the Fleischner Society 2017; Radiology 2017; [id number]. CTA head: Patent anterior and posterior intracranial circulation. No  large vessel occlusion, aneurysm, or significant stenosis. Electronically Signed   By: Kristine Garbe M.D.   On: 11/10/2017 22:18   Mr Brain Wo Contrast  Result Date: 11/11/2017 CLINICAL DATA:  84 y/o F; acute onset of left upper extremity weakness described as loss of grip. EXAM: MRI HEAD WITHOUT CONTRAST TECHNIQUE: Multiplanar, multiecho pulse sequences of the brain and surrounding structures were obtained without intravenous contrast. COMPARISON:  11/10/2017 CT head and CT angiogram head. FINDINGS: Brain: No acute infarction, hemorrhage, hydrocephalus, extra-axial collection or mass lesion. Fewnonspecific foci of T2 FLAIR hyperintense signal abnormality in subcortical and periventricular white matter are compatible withmildchronic microvascular ischemic changes for age. Mildbrain parenchymal volume loss. Vascular: Normal flow voids. Skull and upper cervical spine: Right-greater-than-left lazy occipital joint and atlantoaxial lateral joint effusions, likely degenerative. Sinuses/Orbits: Right mastoid effusion. Bilateral intra-ocular lens replacement. Otherwise negative. Other: None. IMPRESSION: 1. No acute intracranial abnormality identified. 2. Mild chronic microvascular ischemic changes and mild parenchymal volume loss of the brain. Electronically Signed   By: Kristine Garbe M.D.   On: 11/11/2017 02:37    Echo pending   Subjective: Patient seen and examined at bedside.  She feels better stating that her left arm and hand weakness is improved.  No overnight fever, nausea or vomiting.  Discharge Exam: Vitals:   11/11/17 0800 11/11/17 1224  BP: (!) 154/64 (!) 164/65  Pulse: 80 73  Resp:    Temp: 97.8 F (36.6 C) 97.8 F (36.6 C)  SpO2: 96% 100%   Vitals:   11/11/17 0454 11/11/17 0652 11/11/17 0800 11/11/17 1224  BP: (!) 131/48 (!) 137/57 (!) 154/64 (!) 164/65  Pulse: 82 76 80 73  Resp:      Temp:  97.7 F (36.5 C) 97.8 F (36.6 C) 97.8 F (36.6 C)  TempSrc:  Oral  Oral Oral  SpO2:  93% 96% 100%  Weight:      Height:        General exam: Appears calm and comfortable  Respiratory system: Bilateral decreased breath sound at bases Cardiovascular system: S1 & S2 heard, rate controlled   Gastrointestinal system: Abdomen is nondistended, soft and nontender. Normal bowel sounds heard. Central nervous system: Alert and oriented. No focal neurological deficits. Moving extremities Extremities: No cyanosis, clubbing; trace edema     The results of significant diagnostics from this hospitalization (including imaging, microbiology, ancillary and laboratory) are listed below for reference.     Microbiology: No results found for this or any previous visit (from the past 240 hour(s)).   Labs: BNP (last 3 results) No results for input(s): BNP in the last 8760 hours. Basic Metabolic Panel: Recent Labs  Lab 11/10/17 2036 11/10/17 2055 11/11/17 0251  NA 139 140 138  K 4.7 4.5 3.7  CL 103 103 104  CO2 25  --  22  GLUCOSE 111* 104* 110*  BUN 41* 39* 34*  CREATININE 0.80 0.80 0.72  CALCIUM 9.4  --  9.1   Liver Function Tests: Recent Labs  Lab 11/10/17 2036  AST 29  ALT 16  ALKPHOS 77  BILITOT 0.6  PROT  6.8  ALBUMIN 3.7   No results for input(s): LIPASE, AMYLASE in the last 168 hours. No results for input(s): AMMONIA in the last 168 hours. CBC: Recent Labs  Lab 11/10/17 2036 11/10/17 2055 11/11/17 0251  WBC 5.9  --  5.8  NEUTROABS 3.7  --   --   HGB 14.0 14.3 13.4  HCT 41.7 42.0 41.8  MCV 91.0  --  91.5  PLT 179  --  162   Cardiac Enzymes: No results for input(s): CKTOTAL, CKMB, CKMBINDEX, TROPONINI in the last 168 hours. BNP: Invalid input(s): POCBNP CBG: Recent Labs  Lab 11/10/17 2039  GLUCAP 100*   D-Dimer No results for input(s): DDIMER in the last 72 hours. Hgb A1c Recent Labs    11/11/17 0251  HGBA1C 5.1   Lipid Profile Recent Labs    11/11/17 0251  CHOL 184  HDL 64  LDLCALC 103*  TRIG 83  CHOLHDL 2.9    Thyroid function studies No results for input(s): TSH, T4TOTAL, T3FREE, THYROIDAB in the last 72 hours.  Invalid input(s): FREET3 Anemia work up No results for input(s): VITAMINB12, FOLATE, FERRITIN, TIBC, IRON, RETICCTPCT in the last 72 hours. Urinalysis    Component Value Date/Time   COLORURINE YELLOW 02/15/2010 0930   APPEARANCEUR CLEAR 02/15/2010 0930   LABSPEC 1.011 02/15/2010 0930   PHURINE 6.5 02/15/2010 0930   GLUCOSEU NEGATIVE 02/15/2010 0930   HGBUR NEGATIVE 02/15/2010 0930   BILIRUBINUR NEGATIVE 02/15/2010 0930   KETONESUR NEGATIVE 02/15/2010 0930   PROTEINUR NEGATIVE 02/15/2010 0930   UROBILINOGEN 0.2 02/15/2010 0930   NITRITE NEGATIVE 02/15/2010 0930   LEUKOCYTESUR  02/15/2010 0930    NEGATIVE MICROSCOPIC NOT DONE ON URINES WITH NEGATIVE PROTEIN, BLOOD, LEUKOCYTES, NITRITE, OR GLUCOSE <1000 mg/dL.   Sepsis Labs Invalid input(s): PROCALCITONIN,  WBC,  LACTICIDVEN Microbiology No results found for this or any previous visit (from the past 240 hour(s)).   Time coordinating discharge: 35 minutes  SIGNED:   Aline August, MD  [facility] 11/11/2017, 4:21 PM Pager: [phone]  If 7PM-7AM, please contact night-coverage [email or web address] Password [email or web address]

## 2017-11-11 NOTE — Consult Note (Signed)
New Carotid Patient  Requested by:  Aline August, MD  Reason for consultation: R sided TIA  History of Present Illness   [name] is a 84 y.o. ([birth year]) female who presents with chief complaint: weakness in L hand.  Pt was admitted with presumed TIA as this resolved after 20-30 minutes.  Patient noted marked L hand weakness during dinner leading to ED evaluation.  There no trigger or associated sx.  Patient did note two migraine headches earlier in the day.  Patient does note some history of prior bilateral forearm weakness that has been transient also.  The patient has never had amaurosis fugax or monocular blindness.  The patient has never had facial drooping or hemiplegia.  The patient has never had receptive or expressive aphasia.   The patient's previous neurologic deficits have resolved.    The patient's risks factors for carotid disease include: HTN, prior smoker.  In regards to cardiac status: pt denies any chest pain or pressure, she believe she could climb two flight of stairs but would have difficulty due to deconditioning and arthritis.  Past Medical History:  Diagnosis Date  . Arthritis   . Blood transfusion   . Cancer ([place]) 2000   L breast  . Hypertension     Past Surgical History:  Procedure Laterality Date  . ankle replacement surgery    . BREAST SURGERY    . EYE SURGERY  2011   CORRECT WRINKLED RETINA  . EYE SURGERY  07/11/12  . MASTECTOMY, PARTIAL  2000   left side  . MASTECTOMY, PARTIAL  2000   left  . SHOULDER SURGERY     bilateral shoulder replacments  . TOTAL KNEE ARTHROPLASTY     LEFT  . TOTAL KNEE ARTHROPLASTY  11--04-2009   right     Social History   Socioeconomic History  . Marital status: Married    Spouse name: Not on file  . Number of children: Not on file  . Years of education: Not on file  . Highest education level: Not on file  Social Needs  . Financial resource strain: Not on file  . Food insecurity - worry: Not on  file  . Food insecurity - inability: Not on file  . Transportation needs - medical: Not on file  . Transportation needs - non-medical: Not on file  Occupational History  . Not on file  Tobacco Use  . Smoking status: Former Smoker    Last attempt to quit: 07/20/1979    Years since quitting: 38.3  . Smokeless tobacco: Never Used  Substance and Sexual Activity  . Alcohol use: No  . Drug use: No  . Sexual activity: Not on file  Other Topics Concern  . Not on file  Social History Narrative  . Not on file    Family History  Problem Relation Age of Onset  . Cancer Sister   . Heart disease Sister   . Breast cancer Sister        ? age of onset    Current Facility-Administered Medications  Medication Dose Route Frequency Provider Last Rate Last Dose  . [START ON 11/12/2017] aspirin EC tablet 325 mg  325 mg Oral Daily Rinehuls, David L, PA-C      . atenolol (TENORMIN) tablet 50 mg  50 mg Oral QHS Park, Derenda Mis, MD      . atorvastatin (LIPITOR) tablet 80 mg  80 mg Oral q1800 Park, Derenda Mis, MD      .  celecoxib (CELEBREX) capsule 100 mg  100 mg Oral BID Colbert Ewing, MD   100 mg at 11/11/17 0935  . dorzolamide-timolol (COSOPT) 22.3-6.8 MG/ML ophthalmic solution 1 drop  1 drop Both Eyes BID Park, Derenda Mis, MD   1 drop at 11/11/17 0935  . enoxaparin (LOVENOX) injection 40 mg  40 mg Subcutaneous Q24H Colbert Ewing, MD   40 mg at 11/11/17 0935  . furosemide (LASIX) tablet 20 mg  20 mg Oral Daily Colbert Ewing, MD   20 mg at 11/11/17 0934  . senna-docusate (Senokot-S) tablet 1 tablet  1 tablet Oral QHS PRN Park, Derenda Mis, MD        Allergies  Allergen Reactions  . Lisinopril Cough    REVIEW OF SYSTEMS (negative unless checked):   Cardiac:  []  Chest pain or chest pressure? []  Shortness of breath upon activity? []  Shortness of breath when lying flat? []  Irregular heart rhythm?  Vascular:  []  Pain in calf, thigh, or hip brought on by walking? []  Pain in feet at night that  wakes you up from your sleep? []  Blood clot in your veins? []  Leg swelling?  Pulmonary:  []  Oxygen at home? []  Productive cough? []  Wheezing?  Neurologic:  [x]  Sudden weakness in arms or legs? []  Sudden numbness in arms or legs? []  Sudden onset of difficult speaking or slurred speech? []  Temporary loss of vision in one eye? []  Problems with dizziness?  Gastrointestinal:  []  Blood in stool? []  Vomited blood?  Genitourinary:  []  Burning when urinating? []  Blood in urine?  Psychiatric:  []  Major depression  Hematologic:  []  Bleeding problems? []  Problems with blood clotting?  Dermatologic:  []  Rashes or ulcers?  Constitutional:  []  Fever or chills?  Ear/Nose/Throat:  []  Change in hearing? []  Nose bleeds? []  Sore throat?  Musculoskeletal:  []  Back pain? []  Joint pain? []  Muscle pain?   For VQI Use Only   PRE-ADM LIVING Home  AMB STATUS Ambulatory  CAD Sx None  PRIOR CHF None  STRESS TEST No    Physical Examination     Vitals:   11/11/17 0454 11/11/17 0652 11/11/17 0800 11/11/17 1224  BP: (!) 131/48 (!) 137/57 (!) 154/64 (!) 164/65  Pulse: 82 76 80 73  Resp:      Temp:  97.7 F (36.5 C) 97.8 F (36.6 C) 97.8 F (36.6 C)  TempSrc:  Oral Oral Oral  SpO2:  93% 96% 100%  Weight:      Height:       Body mass index is 29.26 kg/m.  General Alert, O x 3, WD, NAD  Head [place]/AT,    Ear/Nose/ Throat Hearing grossly intact, nares without erythema or drainage, oropharynx without Erythema or Exudate, Mallampati score: 3,   Eyes PERRLA, EOMI, Postsurg lenses  Neck Supple, mid-line trachea,    Pulmonary Sym exp, good B air movt, CTA B  Cardiac RRR, Nl S1, S2, no Murmurs, No rubs, No S3,S4  Vascular Vessel Right Left  Radial Palpable Palpable  Brachial Palpable Palpable  Carotid Palpable, No Bruit Palpable, No Bruit  Aorta Not palpable N/A  Femoral Palpable Palpable  Popliteal Not palpable Not palpable  PT Faintly palpable Faintly palpable   DP  Faintly palpable Faintly palpable    Gastro- intestinal soft, non-distended, non-tender to palpation, No guarding or rebound, no HSM, no masses, no CVAT B, No palpable prominent aortic pulse,    Musculo- skeletal M/S 5/5 throughout  , Extremities without ischemic changes  ,  Non-pitting edema present: 1+ B, Varicosities present: B small, No Lipodermatosclerosis present  Neurologic Cranial nerves 2-12 intact , Pain and light touch intact in extremities , Motor exam as listed above  Psychiatric Judgement intact, Mood & affect appropriate for pt's clinical situation  Dermatologic See M/S exam for extremity exam, No rashes otherwise noted  Lymphatic  Palpable lymph nodes: None    Laboratory   CBC CBC Latest Ref Rng & Units 11/11/2017 11/10/2017 11/10/2017  WBC 4.0 - 10.5 K/uL 5.8 - 5.9  Hemoglobin 12.0 - 15.0 g/dL 13.4 14.3 14.0  Hematocrit 36.0 - 46.0 % 41.8 42.0 41.7  Platelets 150 - 400 K/uL 162 - 179    BMP BMP Latest Ref Rng & Units 11/11/2017 11/10/2017 11/10/2017  Glucose 65 - 99 mg/dL 110(H) 104(H) 111(H)  BUN 6 - 20 mg/dL 34(H) 39(H) 41(H)  Creatinine 0.44 - 1.00 mg/dL 0.72 0.80 0.80  Sodium 135 - 145 mmol/L 138 140 139  Potassium 3.5 - 5.1 mmol/L 3.7 4.5 4.7  Chloride 101 - 111 mmol/L 104 103 103  CO2 22 - 32 mmol/L 22 - 25  Calcium 8.9 - 10.3 mg/dL 9.1 - 9.4    Coagulation Lab Results  Component Value Date   INR 0.97 11/10/2017   INR 1.77 (H) 02/24/2010   INR 1.79 (H) 02/23/2010   No results found for: PTT  Lipids    Component Value Date/Time   CHOL 184 11/11/2017 0251   TRIG 83 11/11/2017 0251   HDL 64 11/11/2017 0251   CHOLHDL 2.9 11/11/2017 0251   VLDL 17 11/11/2017 0251   LDLCALC 103 (H) 11/11/2017 0251    Radiology     Ct Angio Head W Or Wo Contrast  Result Date: 11/10/2017 CLINICAL DATA:  84 y/o  F; left hand weakness. EXAM: CT ANGIOGRAPHY HEAD AND NECK TECHNIQUE: Multidetector CT imaging of the head and neck was performed using the standard protocol during  bolus administration of intravenous contrast. Multiplanar CT image reconstructions and MIPs were obtained to evaluate the vascular anatomy. Carotid stenosis measurements (when applicable) are obtained utilizing NASCET criteria, using the distal internal carotid diameter as the denominator. CONTRAST:  46mL ISOVUE-370 IOPAMIDOL (ISOVUE-370) INJECTION 76% COMPARISON:  07/20/2011 CT head FINDINGS: CT HEAD FINDINGS Brain: No evidence of acute infarction, hemorrhage, hydrocephalus, extra-axial collection or mass lesion/mass effect. Few nonspecific punctate calcifications are present within sulci over the convexities likely representing sequelae of prior infectious or inflammatory process. Vascular: As below. Skull: Normal. Negative for fracture or focal lesion. Sinuses: Right mastoid tip effusion.  Otherwise negative. Orbits: No acute finding.  Bilateral intra-ocular lens replacement. Review of the MIP images confirms the above findings CTA NECK FINDINGS Aortic arch: Standard branching. Moderate calcific atherosclerosis. Dense calcified plaque of left subclavian artery origin with moderate to severe approximately 70% stenosis (series 9, image 162), accurate assessment of luminal stenosis suboptimal due to dense calcification. Right carotid system: Severe mixed plaque of the carotid bifurcation with severe greater than 70% proximal ICA stenosis. Left carotid system: Severe predominantly calcified plaque of the carotid bifurcation with mild to moderate 50% proximal ICA stenosis. Vertebral arteries: Right dominant. Left V1 segment partially obscured by photon starvation artifact. No evidence of dissection, stenosis (50% or greater) or occlusion. Skeleton: Severe cervical spondylosis with predominantly discogenic degenerative changes. Grade 1 C2-3 and C7-T1 anterolisthesis. C3-4 interbody fusion. Uncovertebral and facet hypertrophy with bilateral bony foraminal encroachment throughout the cervical spine. Other neck:  Subcentimeter thyroid nodules. Upper chest: Clustered nodules in the right upper  lobe along the major fissure the largest measuring 15 x 12 mm (series 9, image 186). Additional small cluster of nodules peripherally in the superior segment of right lower lobe (series 9, image 180). Review of the MIP images confirms the above findings CTA HEAD FINDINGS Anterior circulation: No significant stenosis, proximal occlusion, aneurysm, or vascular malformation. Mild calcific atherosclerosis of carotid siphons without significant stenosis. Posterior circulation: No significant stenosis, proximal occlusion, aneurysm, or vascular malformation. Venous sinuses: As permitted by contrast timing, patent. Anatomic variants: Complete circle-of-Willis.  Fetal left PCA. Delayed phase: No abnormal intracranial enhancement. Review of the MIP images confirms the above findings IMPRESSION: CT head: 1. No acute intracranial abnormality identified. 2. ASPECTS is 10. 3. Few nonspecific punctate calcifications within sulci over convexities, probably sequelae of old infectious or inflammatory process. CTA neck: 1. Left subclavian artery origin moderate to severe 70% stenosis with dense calcified plaque. Accurate assessment of luminal stenosis suboptimal due to dense calcification. 2. Right proximal ICA severe greater than 70% stenosis with severe mixed plaque including dense calcification. 3. Left proximal ICA mild to moderate 50% stenosis with severe predominantly calcified plaque. 4. Patent bilateral vertebral arteries. No evidence for dissection or aneurysm. 5. Severe cervical spine degenerative changes. 6. Clustered nodules in right upper lobe and superior segment of right lower lobe probably representing infectious bronchiolitis. The largest nodular opacity measures 15 mm. Non-contrast chest CT at 3-6 months is recommended. If the nodules are stable at time of repeat CT, then future CT at 18-24 months (from today's scan) is considered  optional for low-risk patients, but is recommended for high-risk patients. This recommendation follows the consensus statement: Guidelines for Management of Incidental Pulmonary Nodules Detected on CT Images: From the Fleischner Society 2017; Radiology 2017; 284:228-243. CTA head: Patent anterior and posterior intracranial circulation. No large vessel occlusion, aneurysm, or significant stenosis. Electronically Signed   By: Kristine Garbe M.D.   On: 11/10/2017 22:18   Ct Angio Neck W Or Wo Contrast  Result Date: 11/10/2017 CLINICAL DATA:  84 y/o  F; left hand weakness. EXAM: CT ANGIOGRAPHY HEAD AND NECK TECHNIQUE: Multidetector CT imaging of the head and neck was performed using the standard protocol during bolus administration of intravenous contrast. Multiplanar CT image reconstructions and MIPs were obtained to evaluate the vascular anatomy. Carotid stenosis measurements (when applicable) are obtained utilizing NASCET criteria, using the distal internal carotid diameter as the denominator. CONTRAST:  69mL ISOVUE-370 IOPAMIDOL (ISOVUE-370) INJECTION 76% COMPARISON:  07/20/2011 CT head FINDINGS: CT HEAD FINDINGS Brain: No evidence of acute infarction, hemorrhage, hydrocephalus, extra-axial collection or mass lesion/mass effect. Few nonspecific punctate calcifications are present within sulci over the convexities likely representing sequelae of prior infectious or inflammatory process. Vascular: As below. Skull: Normal. Negative for fracture or focal lesion. Sinuses: Right mastoid tip effusion.  Otherwise negative. Orbits: No acute finding.  Bilateral intra-ocular lens replacement. Review of the MIP images confirms the above findings CTA NECK FINDINGS Aortic arch: Standard branching. Moderate calcific atherosclerosis. Dense calcified plaque of left subclavian artery origin with moderate to severe approximately 70% stenosis (series 9, image 162), accurate assessment of luminal stenosis suboptimal due to  dense calcification. Right carotid system: Severe mixed plaque of the carotid bifurcation with severe greater than 70% proximal ICA stenosis. Left carotid system: Severe predominantly calcified plaque of the carotid bifurcation with mild to moderate 50% proximal ICA stenosis. Vertebral arteries: Right dominant. Left V1 segment partially obscured by photon starvation artifact. No evidence of dissection, stenosis (50% or greater) or  occlusion. Skeleton: Severe cervical spondylosis with predominantly discogenic degenerative changes. Grade 1 C2-3 and C7-T1 anterolisthesis. C3-4 interbody fusion. Uncovertebral and facet hypertrophy with bilateral bony foraminal encroachment throughout the cervical spine. Other neck: Subcentimeter thyroid nodules. Upper chest: Clustered nodules in the right upper lobe along the major fissure the largest measuring 15 x 12 mm (series 9, image 186). Additional small cluster of nodules peripherally in the superior segment of right lower lobe (series 9, image 180). Review of the MIP images confirms the above findings CTA HEAD FINDINGS Anterior circulation: No significant stenosis, proximal occlusion, aneurysm, or vascular malformation. Mild calcific atherosclerosis of carotid siphons without significant stenosis. Posterior circulation: No significant stenosis, proximal occlusion, aneurysm, or vascular malformation. Venous sinuses: As permitted by contrast timing, patent. Anatomic variants: Complete circle-of-Willis.  Fetal left PCA. Delayed phase: No abnormal intracranial enhancement. Review of the MIP images confirms the above findings IMPRESSION: CT head: 1. No acute intracranial abnormality identified. 2. ASPECTS is 10. 3. Few nonspecific punctate calcifications within sulci over convexities, probably sequelae of old infectious or inflammatory process. CTA neck: 1. Left subclavian artery origin moderate to severe 70% stenosis with dense calcified plaque. Accurate assessment of luminal  stenosis suboptimal due to dense calcification. 2. Right proximal ICA severe greater than 70% stenosis with severe mixed plaque including dense calcification. 3. Left proximal ICA mild to moderate 50% stenosis with severe predominantly calcified plaque. 4. Patent bilateral vertebral arteries. No evidence for dissection or aneurysm. 5. Severe cervical spine degenerative changes. 6. Clustered nodules in right upper lobe and superior segment of right lower lobe probably representing infectious bronchiolitis. The largest nodular opacity measures 15 mm. Non-contrast chest CT at 3-6 months is recommended. If the nodules are stable at time of repeat CT, then future CT at 18-24 months (from today's scan) is considered optional for low-risk patients, but is recommended for high-risk patients. This recommendation follows the consensus statement: Guidelines for Management of Incidental Pulmonary Nodules Detected on CT Images: From the Fleischner Society 2017; Radiology 2017; 284:228-243. CTA head: Patent anterior and posterior intracranial circulation. No large vessel occlusion, aneurysm, or significant stenosis. Electronically Signed   By: Kristine Garbe M.D.   On: 11/10/2017 22:18   Mr Brain Wo Contrast  Result Date: 11/11/2017 CLINICAL DATA:  84 y/o F; acute onset of left upper extremity weakness described as loss of grip. EXAM: MRI HEAD WITHOUT CONTRAST TECHNIQUE: Multiplanar, multiecho pulse sequences of the brain and surrounding structures were obtained without intravenous contrast. COMPARISON:  11/10/2017 CT head and CT angiogram head. FINDINGS: Brain: No acute infarction, hemorrhage, hydrocephalus, extra-axial collection or mass lesion. Fewnonspecific foci of T2 FLAIR hyperintense signal abnormality in subcortical and periventricular white matter are compatible withmildchronic microvascular ischemic changes for age. Mildbrain parenchymal volume loss. Vascular: Normal flow voids. Skull and upper cervical  spine: Right-greater-than-left lazy occipital joint and atlantoaxial lateral joint effusions, likely degenerative. Sinuses/Orbits: Right mastoid effusion. Bilateral intra-ocular lens replacement. Otherwise negative. Other: None. IMPRESSION: 1. No acute intracranial abnormality identified. 2. Mild chronic microvascular ischemic changes and mild parenchymal volume loss of the brain. Electronically Signed   By: Kristine Garbe M.D.   On: 11/11/2017 02:37    I reviewed this patient;s CTA Neck, R ICA stenosis >80% with mixed morphology plaque vs thrombus with significant calcification.  L ICA stenosis <80% with less extent of mixed morphology plaque.   Non-invasive Vascular Imaging   B Carotid Duplex (11/11/2017):  R ICA stenosis:  80-99% R VA: patent and antegrade L ICA stenosis:  60-79% L VA: patent and antegrade   Medical Decision Making   [name] is a 84 y.o. female who presents with: likely sx RICA stenosis >80%, asx L ICA stenosis <80%   The morphology of the R ICA lesion is concerning for this being the etiology of the TIA.  Natural history of CVA/TIA suggest the highest probability of recurrent neurologic event is the first month, especially the first two weeks.  Subsequently, I would not delay this patient's procedure.  Based on the patient's vascular studies and examination, I have offered the patient: R CEA. I discussed with the patient the risks, benefits, and alternatives to carotid endarterectomy.   The patient is not a candidate for carotid artery stenting due to increased stroke risk based on CREST. I discussed the procedural details of carotid endarterectomy with the patient.   The patient is aware that the risks of carotid endarterectomy include but are not limited to: bleeding, infection, stroke, myocardial infarction, death, cranial nerve injuries both temporary and permanent, neck hematoma, possible airway compromise, labile blood pressure  post-operatively, cerebral hyperperfusion syndrome, and possible need for additional interventions in the future.  The patient is aware of the risks and agrees to proceed forward with the procedure.  Will try to schedule patient for this coming Friday.  Will get patient into the [facility] preop clinic ASAP to avoid any delays. I discussed in depth with the patient the nature of atherosclerosis, and emphasized the importance of maximal medical management including strict control of blood pressure, blood glucose, and lipid levels, obtaining regular exercise, antiplatelet agents, and cessation of smoking.   The patient is currently on a statin: Lipitor.  I added Plavix 75 mg PO daily to this patient's anti-platelet regimen. The patient is currently on an anti-platelet: ASA and Plavix.  The patient is aware that without maximal medical management the underlying atherosclerotic disease process will progress, limiting the benefit of any interventions.  Thank you for allowing [place] to participate in this patient's care.   [name], MD, FACS [facility] Office: [phone] Pager: [phone]  11/11/2017, 6:26 PM

## 2017-11-11 NOTE — H&P (View-Only) (Signed)
New Carotid Patient  Requested by:  Aline August, MD  Reason for consultation: R sided TIA  History of Present Illness   [name] is a 84 y.o. (07/06/1931) female who presents with chief complaint: weakness in L hand.  Pt was admitted with presumed TIA as this resolved after 20-30 minutes.  Patient noted marked L hand weakness during dinner leading to ED evaluation.  There no trigger or associated sx.  Patient did note two migraine headches earlier in the day.  Patient does note some history of prior bilateral forearm weakness that has been transient also.  The patient has never had amaurosis fugax or monocular blindness.  The patient has never had facial drooping or hemiplegia.  The patient has never had receptive or expressive aphasia.   The patient's previous neurologic deficits have resolved.    The patient's risks factors for carotid disease include: HTN, prior smoker.  In regards to cardiac status: pt denies any chest pain or pressure, she believe she could climb two flight of stairs but would have difficulty due to deconditioning and arthritis.  Past Medical History:  Diagnosis Date  . Arthritis   . Blood transfusion   . Cancer ([place]) 2000   L breast  . Hypertension     Past Surgical History:  Procedure Laterality Date  . ankle replacement surgery    . BREAST SURGERY    . EYE SURGERY  2011   CORRECT WRINKLED RETINA  . EYE SURGERY  07/11/12  . MASTECTOMY, PARTIAL  2000   left side  . MASTECTOMY, PARTIAL  2000   left  . SHOULDER SURGERY     bilateral shoulder replacments  . TOTAL KNEE ARTHROPLASTY     LEFT  . TOTAL KNEE ARTHROPLASTY  11--04-2009   right     Social History   Socioeconomic History  . Marital status: Married    Spouse name: Not on file  . Number of children: Not on file  . Years of education: Not on file  . Highest education level: Not on file  Social Needs  . Financial resource strain: Not on file  . Food insecurity - worry: Not on  file  . Food insecurity - inability: Not on file  . Transportation needs - medical: Not on file  . Transportation needs - non-medical: Not on file  Occupational History  . Not on file  Tobacco Use  . Smoking status: Former Smoker    Last attempt to quit: 07/20/1979    Years since quitting: 38.3  . Smokeless tobacco: Never Used  Substance and Sexual Activity  . Alcohol use: No  . Drug use: No  . Sexual activity: Not on file  Other Topics Concern  . Not on file  Social History Narrative  . Not on file    Family History  Problem Relation Age of Onset  . Cancer Sister   . Heart disease Sister   . Breast cancer Sister        ? age of onset    Current Facility-Administered Medications  Medication Dose Route Frequency Provider Last Rate Last Dose  . [START ON 11/12/2017] aspirin EC tablet 325 mg  325 mg Oral Daily Rinehuls, David L, PA-C      . atenolol (TENORMIN) tablet 50 mg  50 mg Oral QHS Park, Derenda Mis, MD      . atorvastatin (LIPITOR) tablet 80 mg  80 mg Oral q1800 Park, Derenda Mis, MD      .  celecoxib (CELEBREX) capsule 100 mg  100 mg Oral BID Colbert Ewing, MD   100 mg at 11/11/17 0935  . dorzolamide-timolol (COSOPT) 22.3-6.8 MG/ML ophthalmic solution 1 drop  1 drop Both Eyes BID Park, Derenda Mis, MD   1 drop at 11/11/17 0935  . enoxaparin (LOVENOX) injection 40 mg  40 mg Subcutaneous Q24H Colbert Ewing, MD   40 mg at 11/11/17 0935  . furosemide (LASIX) tablet 20 mg  20 mg Oral Daily Colbert Ewing, MD   20 mg at 11/11/17 0934  . senna-docusate (Senokot-S) tablet 1 tablet  1 tablet Oral QHS PRN Park, Derenda Mis, MD        Allergies  Allergen Reactions  . Lisinopril Cough    REVIEW OF SYSTEMS (negative unless checked):   Cardiac:  []  Chest pain or chest pressure? []  Shortness of breath upon activity? []  Shortness of breath when lying flat? []  Irregular heart rhythm?  Vascular:  []  Pain in calf, thigh, or hip brought on by walking? []  Pain in feet at night that  wakes you up from your sleep? []  Blood clot in your veins? []  Leg swelling?  Pulmonary:  []  Oxygen at home? []  Productive cough? []  Wheezing?  Neurologic:  [x]  Sudden weakness in arms or legs? []  Sudden numbness in arms or legs? []  Sudden onset of difficult speaking or slurred speech? []  Temporary loss of vision in one eye? []  Problems with dizziness?  Gastrointestinal:  []  Blood in stool? []  Vomited blood?  Genitourinary:  []  Burning when urinating? []  Blood in urine?  Psychiatric:  []  Major depression  Hematologic:  []  Bleeding problems? []  Problems with blood clotting?  Dermatologic:  []  Rashes or ulcers?  Constitutional:  []  Fever or chills?  Ear/Nose/Throat:  []  Change in hearing? []  Nose bleeds? []  Sore throat?  Musculoskeletal:  []  Back pain? []  Joint pain? []  Muscle pain?   For VQI Use Only   PRE-ADM LIVING Home  AMB STATUS Ambulatory  CAD Sx None  PRIOR CHF None  STRESS TEST No    Physical Examination     Vitals:   11/11/17 0454 11/11/17 0652 11/11/17 0800 11/11/17 1224  BP: (!) 131/48 (!) 137/57 (!) 154/64 (!) 164/65  Pulse: 82 76 80 73  Resp:      Temp:  97.7 F (36.5 C) 97.8 F (36.6 C) 97.8 F (36.6 C)  TempSrc:  Oral Oral Oral  SpO2:  93% 96% 100%  Weight:      Height:       Body mass index is 29.26 kg/m.  General Alert, O x 3, WD, NAD  Head Bandana/AT,    Ear/Nose/ Throat Hearing grossly intact, nares without erythema or drainage, oropharynx without Erythema or Exudate, Mallampati score: 3,   Eyes PERRLA, EOMI, Postsurg lenses  Neck Supple, mid-line trachea,    Pulmonary Sym exp, good B air movt, CTA B  Cardiac RRR, Nl S1, S2, no Murmurs, No rubs, No S3,S4  Vascular Vessel Right Left  Radial Palpable Palpable  Brachial Palpable Palpable  Carotid Palpable, No Bruit Palpable, No Bruit  Aorta Not palpable N/A  Femoral Palpable Palpable  Popliteal Not palpable Not palpable  PT Faintly palpable Faintly palpable   DP  Faintly palpable Faintly palpable    Gastro- intestinal soft, non-distended, non-tender to palpation, No guarding or rebound, no HSM, no masses, no CVAT B, No palpable prominent aortic pulse,    Musculo- skeletal M/S 5/5 throughout  , Extremities without ischemic changes  ,  Non-pitting edema present: 1+ B, Varicosities present: B small, No Lipodermatosclerosis present  Neurologic Cranial nerves 2-12 intact , Pain and light touch intact in extremities , Motor exam as listed above  Psychiatric Judgement intact, Mood & affect appropriate for pt's clinical situation  Dermatologic See M/S exam for extremity exam, No rashes otherwise noted  Lymphatic  Palpable lymph nodes: None    Laboratory   CBC CBC Latest Ref Rng & Units 11/11/2017 11/10/2017 11/10/2017  WBC 4.0 - 10.5 K/uL 5.8 - 5.9  Hemoglobin 12.0 - 15.0 g/dL 13.4 14.3 14.0  Hematocrit 36.0 - 46.0 % 41.8 42.0 41.7  Platelets 150 - 400 K/uL 162 - 179    BMP BMP Latest Ref Rng & Units 11/11/2017 11/10/2017 11/10/2017  Glucose 65 - 99 mg/dL 110(H) 104(H) 111(H)  BUN 6 - 20 mg/dL 34(H) 39(H) 41(H)  Creatinine 0.44 - 1.00 mg/dL 0.72 0.80 0.80  Sodium 135 - 145 mmol/L 138 140 139  Potassium 3.5 - 5.1 mmol/L 3.7 4.5 4.7  Chloride 101 - 111 mmol/L 104 103 103  CO2 22 - 32 mmol/L 22 - 25  Calcium 8.9 - 10.3 mg/dL 9.1 - 9.4    Coagulation Lab Results  Component Value Date   INR 0.97 11/10/2017   INR 1.77 (H) 02/24/2010   INR 1.79 (H) 02/23/2010   No results found for: PTT  Lipids    Component Value Date/Time   CHOL 184 11/11/2017 0251   TRIG 83 11/11/2017 0251   HDL 64 11/11/2017 0251   CHOLHDL 2.9 11/11/2017 0251   VLDL 17 11/11/2017 0251   LDLCALC 103 (H) 11/11/2017 0251    Radiology     Ct Angio Head W Or Wo Contrast  Result Date: 11/10/2017 CLINICAL DATA:  84 y/o  F; left hand weakness. EXAM: CT ANGIOGRAPHY HEAD AND NECK TECHNIQUE: Multidetector CT imaging of the head and neck was performed using the standard protocol during  bolus administration of intravenous contrast. Multiplanar CT image reconstructions and MIPs were obtained to evaluate the vascular anatomy. Carotid stenosis measurements (when applicable) are obtained utilizing NASCET criteria, using the distal internal carotid diameter as the denominator. CONTRAST:  46mL ISOVUE-370 IOPAMIDOL (ISOVUE-370) INJECTION 76% COMPARISON:  07/20/2011 CT head FINDINGS: CT HEAD FINDINGS Brain: No evidence of acute infarction, hemorrhage, hydrocephalus, extra-axial collection or mass lesion/mass effect. Few nonspecific punctate calcifications are present within sulci over the convexities likely representing sequelae of prior infectious or inflammatory process. Vascular: As below. Skull: Normal. Negative for fracture or focal lesion. Sinuses: Right mastoid tip effusion.  Otherwise negative. Orbits: No acute finding.  Bilateral intra-ocular lens replacement. Review of the MIP images confirms the above findings CTA NECK FINDINGS Aortic arch: Standard branching. Moderate calcific atherosclerosis. Dense calcified plaque of left subclavian artery origin with moderate to severe approximately 70% stenosis (series 9, image 162), accurate assessment of luminal stenosis suboptimal due to dense calcification. Right carotid system: Severe mixed plaque of the carotid bifurcation with severe greater than 70% proximal ICA stenosis. Left carotid system: Severe predominantly calcified plaque of the carotid bifurcation with mild to moderate 50% proximal ICA stenosis. Vertebral arteries: Right dominant. Left V1 segment partially obscured by photon starvation artifact. No evidence of dissection, stenosis (50% or greater) or occlusion. Skeleton: Severe cervical spondylosis with predominantly discogenic degenerative changes. Grade 1 C2-3 and C7-T1 anterolisthesis. C3-4 interbody fusion. Uncovertebral and facet hypertrophy with bilateral bony foraminal encroachment throughout the cervical spine. Other neck:  Subcentimeter thyroid nodules. Upper chest: Clustered nodules in the right upper  lobe along the major fissure the largest measuring 15 x 12 mm (series 9, image 186). Additional small cluster of nodules peripherally in the superior segment of right lower lobe (series 9, image 180). Review of the MIP images confirms the above findings CTA HEAD FINDINGS Anterior circulation: No significant stenosis, proximal occlusion, aneurysm, or vascular malformation. Mild calcific atherosclerosis of carotid siphons without significant stenosis. Posterior circulation: No significant stenosis, proximal occlusion, aneurysm, or vascular malformation. Venous sinuses: As permitted by contrast timing, patent. Anatomic variants: Complete circle-of-Willis.  Fetal left PCA. Delayed phase: No abnormal intracranial enhancement. Review of the MIP images confirms the above findings IMPRESSION: CT head: 1. No acute intracranial abnormality identified. 2. ASPECTS is 10. 3. Few nonspecific punctate calcifications within sulci over convexities, probably sequelae of old infectious or inflammatory process. CTA neck: 1. Left subclavian artery origin moderate to severe 70% stenosis with dense calcified plaque. Accurate assessment of luminal stenosis suboptimal due to dense calcification. 2. Right proximal ICA severe greater than 70% stenosis with severe mixed plaque including dense calcification. 3. Left proximal ICA mild to moderate 50% stenosis with severe predominantly calcified plaque. 4. Patent bilateral vertebral arteries. No evidence for dissection or aneurysm. 5. Severe cervical spine degenerative changes. 6. Clustered nodules in right upper lobe and superior segment of right lower lobe probably representing infectious bronchiolitis. The largest nodular opacity measures 15 mm. Non-contrast chest CT at 3-6 months is recommended. If the nodules are stable at time of repeat CT, then future CT at 18-24 months (from today's scan) is considered  optional for low-risk patients, but is recommended for high-risk patients. This recommendation follows the consensus statement: Guidelines for Management of Incidental Pulmonary Nodules Detected on CT Images: From the Fleischner Society 2017; Radiology 2017; 284:228-243. CTA head: Patent anterior and posterior intracranial circulation. No large vessel occlusion, aneurysm, or significant stenosis. Electronically Signed   By: Kristine Garbe M.D.   On: 11/10/2017 22:18   Ct Angio Neck W Or Wo Contrast  Result Date: 11/10/2017 CLINICAL DATA:  84 y/o  F; left hand weakness. EXAM: CT ANGIOGRAPHY HEAD AND NECK TECHNIQUE: Multidetector CT imaging of the head and neck was performed using the standard protocol during bolus administration of intravenous contrast. Multiplanar CT image reconstructions and MIPs were obtained to evaluate the vascular anatomy. Carotid stenosis measurements (when applicable) are obtained utilizing NASCET criteria, using the distal internal carotid diameter as the denominator. CONTRAST:  61mL ISOVUE-370 IOPAMIDOL (ISOVUE-370) INJECTION 76% COMPARISON:  07/20/2011 CT head FINDINGS: CT HEAD FINDINGS Brain: No evidence of acute infarction, hemorrhage, hydrocephalus, extra-axial collection or mass lesion/mass effect. Few nonspecific punctate calcifications are present within sulci over the convexities likely representing sequelae of prior infectious or inflammatory process. Vascular: As below. Skull: Normal. Negative for fracture or focal lesion. Sinuses: Right mastoid tip effusion.  Otherwise negative. Orbits: No acute finding.  Bilateral intra-ocular lens replacement. Review of the MIP images confirms the above findings CTA NECK FINDINGS Aortic arch: Standard branching. Moderate calcific atherosclerosis. Dense calcified plaque of left subclavian artery origin with moderate to severe approximately 70% stenosis (series 9, image 162), accurate assessment of luminal stenosis suboptimal due to  dense calcification. Right carotid system: Severe mixed plaque of the carotid bifurcation with severe greater than 70% proximal ICA stenosis. Left carotid system: Severe predominantly calcified plaque of the carotid bifurcation with mild to moderate 50% proximal ICA stenosis. Vertebral arteries: Right dominant. Left V1 segment partially obscured by photon starvation artifact. No evidence of dissection, stenosis (50% or greater) or  occlusion. Skeleton: Severe cervical spondylosis with predominantly discogenic degenerative changes. Grade 1 C2-3 and C7-T1 anterolisthesis. C3-4 interbody fusion. Uncovertebral and facet hypertrophy with bilateral bony foraminal encroachment throughout the cervical spine. Other neck: Subcentimeter thyroid nodules. Upper chest: Clustered nodules in the right upper lobe along the major fissure the largest measuring 15 x 12 mm (series 9, image 186). Additional small cluster of nodules peripherally in the superior segment of right lower lobe (series 9, image 180). Review of the MIP images confirms the above findings CTA HEAD FINDINGS Anterior circulation: No significant stenosis, proximal occlusion, aneurysm, or vascular malformation. Mild calcific atherosclerosis of carotid siphons without significant stenosis. Posterior circulation: No significant stenosis, proximal occlusion, aneurysm, or vascular malformation. Venous sinuses: As permitted by contrast timing, patent. Anatomic variants: Complete circle-of-Willis.  Fetal left PCA. Delayed phase: No abnormal intracranial enhancement. Review of the MIP images confirms the above findings IMPRESSION: CT head: 1. No acute intracranial abnormality identified. 2. ASPECTS is 10. 3. Few nonspecific punctate calcifications within sulci over convexities, probably sequelae of old infectious or inflammatory process. CTA neck: 1. Left subclavian artery origin moderate to severe 70% stenosis with dense calcified plaque. Accurate assessment of luminal  stenosis suboptimal due to dense calcification. 2. Right proximal ICA severe greater than 70% stenosis with severe mixed plaque including dense calcification. 3. Left proximal ICA mild to moderate 50% stenosis with severe predominantly calcified plaque. 4. Patent bilateral vertebral arteries. No evidence for dissection or aneurysm. 5. Severe cervical spine degenerative changes. 6. Clustered nodules in right upper lobe and superior segment of right lower lobe probably representing infectious bronchiolitis. The largest nodular opacity measures 15 mm. Non-contrast chest CT at 3-6 months is recommended. If the nodules are stable at time of repeat CT, then future CT at 18-24 months (from today's scan) is considered optional for low-risk patients, but is recommended for high-risk patients. This recommendation follows the consensus statement: Guidelines for Management of Incidental Pulmonary Nodules Detected on CT Images: From the Fleischner Society 2017; Radiology 2017; 284:228-243. CTA head: Patent anterior and posterior intracranial circulation. No large vessel occlusion, aneurysm, or significant stenosis. Electronically Signed   By: Kristine Garbe M.D.   On: 11/10/2017 22:18   Mr Brain Wo Contrast  Result Date: 11/11/2017 CLINICAL DATA:  84 y/o F; acute onset of left upper extremity weakness described as loss of grip. EXAM: MRI HEAD WITHOUT CONTRAST TECHNIQUE: Multiplanar, multiecho pulse sequences of the brain and surrounding structures were obtained without intravenous contrast. COMPARISON:  11/10/2017 CT head and CT angiogram head. FINDINGS: Brain: No acute infarction, hemorrhage, hydrocephalus, extra-axial collection or mass lesion. Fewnonspecific foci of T2 FLAIR hyperintense signal abnormality in subcortical and periventricular white matter are compatible withmildchronic microvascular ischemic changes for age. Mildbrain parenchymal volume loss. Vascular: Normal flow voids. Skull and upper cervical  spine: Right-greater-than-left lazy occipital joint and atlantoaxial lateral joint effusions, likely degenerative. Sinuses/Orbits: Right mastoid effusion. Bilateral intra-ocular lens replacement. Otherwise negative. Other: None. IMPRESSION: 1. No acute intracranial abnormality identified. 2. Mild chronic microvascular ischemic changes and mild parenchymal volume loss of the brain. Electronically Signed   By: Kristine Garbe M.D.   On: 11/11/2017 02:37    I reviewed this patient;s CTA Neck, R ICA stenosis >80% with mixed morphology plaque vs thrombus with significant calcification.  L ICA stenosis <80% with less extent of mixed morphology plaque.   Non-invasive Vascular Imaging   B Carotid Duplex (11/11/2017):  R ICA stenosis:  80-99% R VA: patent and antegrade L ICA stenosis:  60-79% L VA: patent and antegrade   Medical Decision Making   [name] is a 84 y.o. female who presents with: likely sx RICA stenosis >80%, asx L ICA stenosis <80%   The morphology of the R ICA lesion is concerning for this being the etiology of the TIA.  Natural history of CVA/TIA suggest the highest probability of recurrent neurologic event is the first month, especially the first two weeks.  Subsequently, I would not delay this patient's procedure.  Based on the patient's vascular studies and examination, I have offered the patient: R CEA. I discussed with the patient the risks, benefits, and alternatives to carotid endarterectomy.   The patient is not a candidate for carotid artery stenting due to increased stroke risk based on CREST. I discussed the procedural details of carotid endarterectomy with the patient.   The patient is aware that the risks of carotid endarterectomy include but are not limited to: bleeding, infection, stroke, myocardial infarction, death, cranial nerve injuries both temporary and permanent, neck hematoma, possible airway compromise, labile blood pressure  post-operatively, cerebral hyperperfusion syndrome, and possible need for additional interventions in the future.  The patient is aware of the risks and agrees to proceed forward with the procedure.  Will try to schedule patient for this coming Friday.  Will get patient into the [facility] preop clinic ASAP to avoid any delays. I discussed in depth with the patient the nature of atherosclerosis, and emphasized the importance of maximal medical management including strict control of blood pressure, blood glucose, and lipid levels, obtaining regular exercise, antiplatelet agents, and cessation of smoking.   The patient is currently on a statin: Lipitor.  I added Plavix 75 mg PO daily to this patient's anti-platelet regimen. The patient is currently on an anti-platelet: ASA and Plavix.  The patient is aware that without maximal medical management the underlying atherosclerotic disease process will progress, limiting the benefit of any interventions.  Thank you for allowing [place] to participate in this patient's care.   [name], MD, FACS [facility] Office: [phone] Pager: [phone]  11/11/2017, 6:26 PM

## 2017-11-11 NOTE — Progress Notes (Signed)
VASCULAR LAB PRELIMINARY  PRELIMINARY  PRELIMINARY  PRELIMINARY  Carotid duplex completed.    Preliminary report: 80-99% right ICA stenosis.  60-79% left ICA stenosis.  Vertebral artery flow is antegrade.    Griffen Frayne, RVT 11/11/2017, 4:47 PM

## 2017-11-11 NOTE — Progress Notes (Signed)
Pt arrived at the unit at about, 0228. accompanied by her son. She is alert and oriented X 4. Her vitals signs are within normal limits. She is ambulatory, with a cane.

## 2017-11-11 NOTE — Progress Notes (Signed)
Patient ID: [name], female   DOB: May 19, 1931, 84 y.o.   MRN: [id number]  PROGRESS NOTE    [name]  EHM:[id number] DOB: 1931-08-19 DOA: 11/10/2017 PCP: Shon Baton, MD   Brief Narrative:  84 year old female with history of left breast cancer status post mastectomy, arthritis, hypertension and known right ICA stenosis presented with transient left hand grip weakness on 11/10/2017.  Neurology was consulted.   Assessment & Plan:   Principal Problem:   TIA (transient ischemic attack) Active Problems:   HTN (hypertension)   TIA -Presenting with left  hand weakness. Symptoms self-resolved. R ICA and L subclavian stenosis on CTA head/neck.  -Neurology following. -MRI brain is negative for acute stroke -2D echo pending. -Continue aspirin.  Patient is hesitant about Plavix.  Patient also talk to neurology before starting Plavix. -Continue statin. -PT/OT/SLP evaluation  HTN -Blood pressure stable.  Monitor.  Continue atenolol and Lasix  Hx of peripheral edema, at baseline. Unknown EF although per patient, no known cardiac hx.  - resume home lasix 20mg  daily -Follow echo  Severe arthritis in hands, shoulders and ankle s/p shoulder and ankle replacement -Continue celecoxib  home dosing - continue to discuss with patient to consider switching to naproxen or other agent that has less cardio/stroke risk  Glaucoma  -Continue home dorzolamide-timolol drops b/l   DVT prophylaxis: Lovenox Code Status: Full Family Communication: None at bedside Disposition Plan: Home once cleared by neurology  Consultants: Neurology   Procedures: None  Antimicrobials: None   Subjective: Patient seen and examined at bedside.  She feels better stating that her left arm and hand weakness is improved.  No overnight fever, nausea or vomiting.  Objective: Vitals:   11/11/17 0238 11/11/17 0454 11/11/17 0652 11/11/17 0800  BP: (!) 146/59 (!) 131/48 (!) 137/57 (!) 154/64  Pulse: 88  82 76 80  Resp: 18     Temp: 97.6 F (36.4 C)  97.7 F (36.5 C) 97.8 F (36.6 C)  TempSrc: Oral  Oral Oral  SpO2: 99%  93% 96%  Weight:      Height:        Intake/Output Summary (Last 24 hours) at 11/11/2017 1219 Last data filed at 11/11/2017 3662 Gross per 24 hour  Intake 240 ml  Output -  Net 240 ml   Filed Weights   11/10/17 2024  Weight: 72.6 kg (160 lb)    Examination:  General exam: Appears calm and comfortable  Respiratory system: Bilateral decreased breath sound at bases Cardiovascular system: S1 & S2 heard, rate controlled   Gastrointestinal system: Abdomen is nondistended, soft and nontender. Normal bowel sounds heard. Central nervous system: Alert and oriented. No focal neurological deficits. Moving extremities Extremities: No cyanosis, clubbing, edema    Data Reviewed: I have personally reviewed following labs and imaging studies  CBC: Recent Labs  Lab 11/10/17 2036 11/10/17 2055 11/11/17 0251  WBC 5.9  --  5.8  NEUTROABS 3.7  --   --   HGB 14.0 14.3 13.4  HCT 41.7 42.0 41.8  MCV 91.0  --  91.5  PLT 179  --  947   Basic Metabolic Panel: Recent Labs  Lab 11/10/17 2036 11/10/17 2055 11/11/17 0251  NA 139 140 138  K 4.7 4.5 3.7  CL 103 103 104  CO2 25  --  22  GLUCOSE 111* 104* 110*  BUN 41* 39* 34*  CREATININE 0.80 0.80 0.72  CALCIUM 9.4  --  9.1   GFR: Estimated Creatinine Clearance: 48.8  mL/min (by C-G formula based on SCr of 0.72 mg/dL). Liver Function Tests: Recent Labs  Lab 11/10/17 2036  AST 29  ALT 16  ALKPHOS 77  BILITOT 0.6  PROT 6.8  ALBUMIN 3.7   No results for input(s): LIPASE, AMYLASE in the last 168 hours. No results for input(s): AMMONIA in the last 168 hours. Coagulation Profile: Recent Labs  Lab 11/10/17 2036  INR 0.97   Cardiac Enzymes: No results for input(s): CKTOTAL, CKMB, CKMBINDEX, TROPONINI in the last 168 hours. BNP (last 3 results) No results for input(s): PROBNP in the last 8760  hours. HbA1C: Recent Labs    11/11/17 0251  HGBA1C 5.1   CBG: Recent Labs  Lab 11/10/17 2039  GLUCAP 100*   Lipid Profile: Recent Labs    11/11/17 0251  CHOL 184  HDL 64  LDLCALC 103*  TRIG 83  CHOLHDL 2.9   Thyroid Function Tests: No results for input(s): TSH, T4TOTAL, FREET4, T3FREE, THYROIDAB in the last 72 hours. Anemia Panel: No results for input(s): VITAMINB12, FOLATE, FERRITIN, TIBC, IRON, RETICCTPCT in the last 72 hours. Sepsis Labs: No results for input(s): PROCALCITON, LATICACIDVEN in the last 168 hours.  No results found for this or any previous visit (from the past 240 hour(s)).       Radiology Studies: Ct Angio Head W Or Wo Contrast  Result Date: 11/10/2017 CLINICAL DATA:  84 y/o  F; left hand weakness. EXAM: CT ANGIOGRAPHY HEAD AND NECK TECHNIQUE: Multidetector CT imaging of the head and neck was performed using the standard protocol during bolus administration of intravenous contrast. Multiplanar CT image reconstructions and MIPs were obtained to evaluate the vascular anatomy. Carotid stenosis measurements (when applicable) are obtained utilizing NASCET criteria, using the distal internal carotid diameter as the denominator. CONTRAST:  17mL ISOVUE-370 IOPAMIDOL (ISOVUE-370) INJECTION 76% COMPARISON:  07/20/2011 CT head FINDINGS: CT HEAD FINDINGS Brain: No evidence of acute infarction, hemorrhage, hydrocephalus, extra-axial collection or mass lesion/mass effect. Few nonspecific punctate calcifications are present within sulci over the convexities likely representing sequelae of prior infectious or inflammatory process. Vascular: As below. Skull: Normal. Negative for fracture or focal lesion. Sinuses: Right mastoid tip effusion.  Otherwise negative. Orbits: No acute finding.  Bilateral intra-ocular lens replacement. Review of the MIP images confirms the above findings CTA NECK FINDINGS Aortic arch: Standard branching. Moderate calcific atherosclerosis. Dense  calcified plaque of left subclavian artery origin with moderate to severe approximately 70% stenosis (series 9, image 162), accurate assessment of luminal stenosis suboptimal due to dense calcification. Right carotid system: Severe mixed plaque of the carotid bifurcation with severe greater than 70% proximal ICA stenosis. Left carotid system: Severe predominantly calcified plaque of the carotid bifurcation with mild to moderate 50% proximal ICA stenosis. Vertebral arteries: Right dominant. Left V1 segment partially obscured by photon starvation artifact. No evidence of dissection, stenosis (50% or greater) or occlusion. Skeleton: Severe cervical spondylosis with predominantly discogenic degenerative changes. Grade 1 C2-3 and C7-T1 anterolisthesis. C3-4 interbody fusion. Uncovertebral and facet hypertrophy with bilateral bony foraminal encroachment throughout the cervical spine. Other neck: Subcentimeter thyroid nodules. Upper chest: Clustered nodules in the right upper lobe along the major fissure the largest measuring 15 x 12 mm (series 9, image 186). Additional small cluster of nodules peripherally in the superior segment of right lower lobe (series 9, image 180). Review of the MIP images confirms the above findings CTA HEAD FINDINGS Anterior circulation: No significant stenosis, proximal occlusion, aneurysm, or vascular malformation. Mild calcific atherosclerosis of carotid siphons without  significant stenosis. Posterior circulation: No significant stenosis, proximal occlusion, aneurysm, or vascular malformation. Venous sinuses: As permitted by contrast timing, patent. Anatomic variants: Complete circle-of-Willis.  Fetal left PCA. Delayed phase: No abnormal intracranial enhancement. Review of the MIP images confirms the above findings IMPRESSION: CT head: 1. No acute intracranial abnormality identified. 2. ASPECTS is 10. 3. Few nonspecific punctate calcifications within sulci over convexities, probably sequelae  of old infectious or inflammatory process. CTA neck: 1. Left subclavian artery origin moderate to severe 70% stenosis with dense calcified plaque. Accurate assessment of luminal stenosis suboptimal due to dense calcification. 2. Right proximal ICA severe greater than 70% stenosis with severe mixed plaque including dense calcification. 3. Left proximal ICA mild to moderate 50% stenosis with severe predominantly calcified plaque. 4. Patent bilateral vertebral arteries. No evidence for dissection or aneurysm. 5. Severe cervical spine degenerative changes. 6. Clustered nodules in right upper lobe and superior segment of right lower lobe probably representing infectious bronchiolitis. The largest nodular opacity measures 15 mm. Non-contrast chest CT at 3-6 months is recommended. If the nodules are stable at time of repeat CT, then future CT at 18-24 months (from today's scan) is considered optional for low-risk patients, but is recommended for high-risk patients. This recommendation follows the consensus statement: Guidelines for Management of Incidental Pulmonary Nodules Detected on CT Images: From the Fleischner Society 2017; Radiology 2017; 284:228-243. CTA head: Patent anterior and posterior intracranial circulation. No large vessel occlusion, aneurysm, or significant stenosis. Electronically Signed   By: Kristine Garbe M.D.   On: 11/10/2017 22:18   Ct Angio Neck W Or Wo Contrast  Result Date: 11/10/2017 CLINICAL DATA:  84 y/o  F; left hand weakness. EXAM: CT ANGIOGRAPHY HEAD AND NECK TECHNIQUE: Multidetector CT imaging of the head and neck was performed using the standard protocol during bolus administration of intravenous contrast. Multiplanar CT image reconstructions and MIPs were obtained to evaluate the vascular anatomy. Carotid stenosis measurements (when applicable) are obtained utilizing NASCET criteria, using the distal internal carotid diameter as the denominator. CONTRAST:  64mL ISOVUE-370  IOPAMIDOL (ISOVUE-370) INJECTION 76% COMPARISON:  07/20/2011 CT head FINDINGS: CT HEAD FINDINGS Brain: No evidence of acute infarction, hemorrhage, hydrocephalus, extra-axial collection or mass lesion/mass effect. Few nonspecific punctate calcifications are present within sulci over the convexities likely representing sequelae of prior infectious or inflammatory process. Vascular: As below. Skull: Normal. Negative for fracture or focal lesion. Sinuses: Right mastoid tip effusion.  Otherwise negative. Orbits: No acute finding.  Bilateral intra-ocular lens replacement. Review of the MIP images confirms the above findings CTA NECK FINDINGS Aortic arch: Standard branching. Moderate calcific atherosclerosis. Dense calcified plaque of left subclavian artery origin with moderate to severe approximately 70% stenosis (series 9, image 162), accurate assessment of luminal stenosis suboptimal due to dense calcification. Right carotid system: Severe mixed plaque of the carotid bifurcation with severe greater than 70% proximal ICA stenosis. Left carotid system: Severe predominantly calcified plaque of the carotid bifurcation with mild to moderate 50% proximal ICA stenosis. Vertebral arteries: Right dominant. Left V1 segment partially obscured by photon starvation artifact. No evidence of dissection, stenosis (50% or greater) or occlusion. Skeleton: Severe cervical spondylosis with predominantly discogenic degenerative changes. Grade 1 C2-3 and C7-T1 anterolisthesis. C3-4 interbody fusion. Uncovertebral and facet hypertrophy with bilateral bony foraminal encroachment throughout the cervical spine. Other neck: Subcentimeter thyroid nodules. Upper chest: Clustered nodules in the right upper lobe along the major fissure the largest measuring 15 x 12 mm (series 9, image 186). Additional small cluster  of nodules peripherally in the superior segment of right lower lobe (series 9, image 180). Review of the MIP images confirms the above  findings CTA HEAD FINDINGS Anterior circulation: No significant stenosis, proximal occlusion, aneurysm, or vascular malformation. Mild calcific atherosclerosis of carotid siphons without significant stenosis. Posterior circulation: No significant stenosis, proximal occlusion, aneurysm, or vascular malformation. Venous sinuses: As permitted by contrast timing, patent. Anatomic variants: Complete circle-of-Willis.  Fetal left PCA. Delayed phase: No abnormal intracranial enhancement. Review of the MIP images confirms the above findings IMPRESSION: CT head: 1. No acute intracranial abnormality identified. 2. ASPECTS is 10. 3. Few nonspecific punctate calcifications within sulci over convexities, probably sequelae of old infectious or inflammatory process. CTA neck: 1. Left subclavian artery origin moderate to severe 70% stenosis with dense calcified plaque. Accurate assessment of luminal stenosis suboptimal due to dense calcification. 2. Right proximal ICA severe greater than 70% stenosis with severe mixed plaque including dense calcification. 3. Left proximal ICA mild to moderate 50% stenosis with severe predominantly calcified plaque. 4. Patent bilateral vertebral arteries. No evidence for dissection or aneurysm. 5. Severe cervical spine degenerative changes. 6. Clustered nodules in right upper lobe and superior segment of right lower lobe probably representing infectious bronchiolitis. The largest nodular opacity measures 15 mm. Non-contrast chest CT at 3-6 months is recommended. If the nodules are stable at time of repeat CT, then future CT at 18-24 months (from today's scan) is considered optional for low-risk patients, but is recommended for high-risk patients. This recommendation follows the consensus statement: Guidelines for Management of Incidental Pulmonary Nodules Detected on CT Images: From the Fleischner Society 2017; Radiology 2017; 284:228-243. CTA head: Patent anterior and posterior intracranial  circulation. No large vessel occlusion, aneurysm, or significant stenosis. Electronically Signed   By: Kristine Garbe M.D.   On: 11/10/2017 22:18   Mr Brain Wo Contrast  Result Date: 11/11/2017 CLINICAL DATA:  84 y/o F; acute onset of left upper extremity weakness described as loss of grip. EXAM: MRI HEAD WITHOUT CONTRAST TECHNIQUE: Multiplanar, multiecho pulse sequences of the brain and surrounding structures were obtained without intravenous contrast. COMPARISON:  11/10/2017 CT head and CT angiogram head. FINDINGS: Brain: No acute infarction, hemorrhage, hydrocephalus, extra-axial collection or mass lesion. Fewnonspecific foci of T2 FLAIR hyperintense signal abnormality in subcortical and periventricular white matter are compatible withmildchronic microvascular ischemic changes for age. Mildbrain parenchymal volume loss. Vascular: Normal flow voids. Skull and upper cervical spine: Right-greater-than-left lazy occipital joint and atlantoaxial lateral joint effusions, likely degenerative. Sinuses/Orbits: Right mastoid effusion. Bilateral intra-ocular lens replacement. Otherwise negative. Other: None. IMPRESSION: 1. No acute intracranial abnormality identified. 2. Mild chronic microvascular ischemic changes and mild parenchymal volume loss of the brain. Electronically Signed   By: Kristine Garbe M.D.   On: 11/11/2017 02:37        Scheduled Meds: . aspirin EC  81 mg Oral Daily  . atenolol  50 mg Oral QHS  . atorvastatin  80 mg Oral q1800  . celecoxib  100 mg Oral BID  . dorzolamide-timolol  1 drop Both Eyes BID  . enoxaparin (LOVENOX) injection  40 mg Subcutaneous Q24H  . furosemide  20 mg Oral Daily   Continuous Infusions:   LOS: 0 days        Aline August, MD [facility] Pager [phone]  If 7PM-7AM, please contact night-coverage [email or web address] Password [facility] 11/11/2017, 12:19 PM

## 2017-11-11 NOTE — ED Notes (Signed)
Delay in lab draw,  Pt not in room 

## 2017-11-12 ENCOUNTER — Other Ambulatory Visit: Payer: Self-pay | Admitting: *Deleted

## 2017-11-12 ENCOUNTER — Telehealth: Payer: Self-pay | Admitting: *Deleted

## 2017-11-12 NOTE — Telephone Encounter (Signed)
Call to [name] at [facility] pre-admitting testing that patient will need to be called in for work up for surgery ASAP. Call to patient informed to be at hospital at 5:30 am on 11/16/17 admitting department for surgery. Instructed to stay on ASA and Plavix per Dr. Bridgett Larsson and NPO past MN night prior of surgery. To expect a call and follow the detailed instructions received from the [facility] Pre-admission department about this surgery.

## 2017-11-13 ENCOUNTER — Other Ambulatory Visit: Payer: Self-pay

## 2017-11-13 ENCOUNTER — Encounter (HOSPITAL_COMMUNITY)
Admission: RE | Admit: 2017-11-13 | Discharge: 2017-11-13 | Disposition: A | Discharge status: Home / Self Care | Payer: Medicare Other | Source: Ambulatory Visit | Attending: Vascular Surgery | Admitting: Vascular Surgery

## 2017-11-13 ENCOUNTER — Encounter (HOSPITAL_COMMUNITY): Payer: Self-pay

## 2017-11-13 DIAGNOSIS — G458 Other transient cerebral ischemic attacks and related syndromes: Secondary | ICD-10-CM | POA: Diagnosis not present

## 2017-11-13 DIAGNOSIS — I6529 Occlusion and stenosis of unspecified carotid artery: Secondary | ICD-10-CM | POA: Diagnosis not present

## 2017-11-13 DIAGNOSIS — Z01812 Encounter for preprocedural laboratory examination: Secondary | ICD-10-CM | POA: Insufficient documentation

## 2017-11-13 DIAGNOSIS — J3089 Other allergic rhinitis: Secondary | ICD-10-CM | POA: Diagnosis not present

## 2017-11-13 DIAGNOSIS — I779 Disorder of arteries and arterioles, unspecified: Secondary | ICD-10-CM | POA: Diagnosis not present

## 2017-11-13 DIAGNOSIS — E7849 Other hyperlipidemia: Secondary | ICD-10-CM | POA: Diagnosis not present

## 2017-11-13 DIAGNOSIS — Z6827 Body mass index (BMI) 27.0-27.9, adult: Secondary | ICD-10-CM | POA: Diagnosis not present

## 2017-11-13 DIAGNOSIS — I10 Essential (primary) hypertension: Secondary | ICD-10-CM | POA: Diagnosis not present

## 2017-11-13 DIAGNOSIS — I7300 Raynaud's syndrome without gangrene: Secondary | ICD-10-CM | POA: Diagnosis not present

## 2017-11-13 DIAGNOSIS — M1990 Unspecified osteoarthritis, unspecified site: Secondary | ICD-10-CM | POA: Diagnosis not present

## 2017-11-13 DIAGNOSIS — J449 Chronic obstructive pulmonary disease, unspecified: Secondary | ICD-10-CM | POA: Diagnosis not present

## 2017-11-13 HISTORY — DX: Pneumonia, unspecified organism: J18.9

## 2017-11-13 HISTORY — DX: Occlusion and stenosis of right carotid artery: I65.21

## 2017-11-13 HISTORY — DX: Transient cerebral ischemic attack, unspecified: G45.9

## 2017-11-13 LAB — COMPREHENSIVE METABOLIC PANEL
ALT: 19 U/L (ref 14–54)
AST: 31 U/L (ref 15–41)
Albumin: 3.7 g/dL (ref 3.5–5.0)
Alkaline Phosphatase: 81 U/L (ref 38–126)
Anion gap: 10 (ref 5–15)
BILIRUBIN TOTAL: 0.7 mg/dL (ref 0.3–1.2)
BUN: 36 mg/dL — AB (ref 6–20)
CHLORIDE: 106 mmol/L (ref 101–111)
CO2: 24 mmol/L (ref 22–32)
CREATININE: 0.74 mg/dL (ref 0.44–1.00)
Calcium: 9.3 mg/dL (ref 8.9–10.3)
GFR calc Af Amer: >60 mL/min (ref >60)
Glucose, Bld: 89 mg/dL (ref 65–99)
Potassium: 4.2 mmol/L (ref 3.5–5.1)
Sodium: 140 mmol/L (ref 135–145)
Total Protein: 7 g/dL (ref 6.5–8.1)

## 2017-11-13 LAB — PROTIME-INR
INR: 0.98
PROTHROMBIN TIME: 12.9 s (ref 11.4–15.2)

## 2017-11-13 LAB — URINALYSIS, ROUTINE W REFLEX MICROSCOPIC
Bilirubin Urine: NEGATIVE
Glucose, UA: NEGATIVE mg/dL
Hgb urine dipstick: NEGATIVE
KETONES UR: NEGATIVE mg/dL
LEUKOCYTES UA: NEGATIVE
Nitrite: NEGATIVE
PH: 6 (ref 5.0–8.0)
PROTEIN: NEGATIVE mg/dL
Specific Gravity, Urine: 1.008 (ref 1.005–1.030)

## 2017-11-13 LAB — TYPE AND SCREEN
ABO/RH(D): O POS
Antibody Screen: NEGATIVE

## 2017-11-13 LAB — SURGICAL PCR SCREEN
MRSA, PCR: NEGATIVE
Staphylococcus aureus: NEGATIVE

## 2017-11-13 LAB — APTT: APTT: 30 s (ref 24–36)

## 2017-11-13 NOTE — Pre-Procedure Instructions (Signed)
LORIAN YAUN  11/13/2017      [id number] - [place],  - [address] AT [address] [place] [address] Phone: [phone] Fax: [phone]    Your procedure is scheduled on Friday, November 16, 2017  Report to [facility] Admitting Entrance "A" at 5:30AM   Call this number if you have problems the morning of surgery:  [phone]   Remember:  Do not eat food or drink liquids after midnight.  Take these medicines the morning of surgery with A SIP OF WATER: AmLODipine (NORVASC), Aspirin, Atenolol (TENORMIN), Clopidogrel (PLAVIX), DORZOLAMIDE Eye drops, and Sodium chloride Eye drops  As of today stop taking all other Aspirin products, Vitamins, Fish oils, and Herbal medications. Also stop all NSAIDS i.e. Advil, Ibuprofen, Motrin, Aleve, Anaprox, Naproxen, BC and Goody Powders.   Do not wear jewelry, make-up or nail polish.  Do not wear lotions, powders, perfumes, or deodorant.  Do not shave 48 hours prior to surgery.    Do not bring valuables to the hospital.  [facility] is not responsible for any belongings or valuables.  Contacts, dentures or bridgework may not be worn into surgery.  Leave your suitcase in the car.  After surgery it may be brought to your room.  For patients admitted to the hospital, discharge time will be determined by your treatment team.  Patients discharged the day of surgery will not be allowed to drive home.   Special instructions:   [facility]- Preparing For Surgery  Before surgery, you can play an important role. Because skin is not sterile, your skin needs to be as free of germs as possible. You can reduce the number of germs on your skin by washing with CHG (chlorahexidine gluconate) Soap before surgery.  CHG is an antiseptic cleaner which kills germs and bonds with the skin to continue killing germs even after washing.  Please do not use if you have an allergy to CHG or  antibacterial soaps. If your skin becomes reddened/irritated stop using the CHG.  Do not shave (including legs and underarms) for at least 48 hours prior to first CHG shower. It is OK to shave your face.  Please follow these instructions carefully.   1. Shower the NIGHT BEFORE SURGERY and the MORNING OF SURGERY with CHG.   2. If you chose to wash your hair, wash your hair first as usual with your normal shampoo.  3. After you shampoo, rinse your hair and body thoroughly to remove the shampoo.  4. Use CHG as you would any other liquid soap. You can apply CHG directly to the skin and wash gently with a scrungie or a clean washcloth.   5. Apply the CHG Soap to your body ONLY FROM THE NECK DOWN.  Do not use on open wounds or open sores. Avoid contact with your eyes, ears, mouth and genitals (private parts). Wash Face and genitals (private parts)  with your normal soap.  6. Wash thoroughly, paying special attention to the area where your surgery will be performed.  7. Thoroughly rinse your body with warm water from the neck down.  8. DO NOT shower/wash with your normal soap after using and rinsing off the CHG Soap.  9. Pat yourself dry with a CLEAN TOWEL.  10. Wear CLEAN PAJAMAS to bed the night before surgery, wear comfortable clothes the morning of surgery  11. Place CLEAN SHEETS on your bed the night of your first shower  and DO NOT SLEEP WITH PETS.  Day of Surgery: Do not apply any deodorants/lotions. Please wear clean clothes to the hospital/surgery center.    Please read over the following fact sheets that you were given. Pain Booklet, Coughing and Deep Breathing, MRSA Information and Surgical Site Infection Prevention

## 2017-11-13 NOTE — Progress Notes (Signed)
PCP - Dr. Shon Baton  Cardiologist - Denies  Chest x-ray - Denies  EKG - 11/10/17 (E)  Stress Test - Denies  ECHO - Denies  Cardiac Cath - Denies  Sleep Study - No CPAP - None  LABS- 11/13/17: CMP, PT, PTT, T/S, UA   Anesthesia- Yes- Pt seen in [facility] ED on 11/10/17 for stroke-like symptoms. Pt sts she is to see Dr. Virgina Jock today for surgical clearance, and to ask him if she still needs to have a 2D Echo completed. Pt refused to have the test while in to hospital, because she thought it should be an outpatient procedure.  Pt denies having chest pain, sob, or fever at this time. All instructions explained to the pt, with a verbal understanding of the material. Pt agrees to go over the instructions while at home for a better understanding. The opportunity to ask questions was provided.

## 2017-11-14 DIAGNOSIS — R002 Palpitations: Secondary | ICD-10-CM | POA: Diagnosis not present

## 2017-11-14 NOTE — Progress Notes (Addendum)
Anesthesia Chart Review:  Pt is an 84 year old female scheduled for R CEA on 11/16/2017 with [name], MD.   - PCP is Shon Baton, MD  - Was evaluated by cardiologist Vernell Leep, MD 11/15/17 after echo 11/14/17 performed in his office. Pt then saw Dr. Virgina Jock and stress test ordered for 11/16/17.  Note in epic by Penni Homans, RN 11/16/17 indicates patient cleared for surgery as preliminary stress test results normal; report to be sent when final.   PMH includes:  TIA (11/10/17), HTN, R carotid stenosis, breast cancer. Former smoker (quit 1980). BMI 29  - Hospitalized 3/2-11/11/17 for TIA. Found to have significant R carotid stenosis. Pt was to have echo prior to discharge but refused, saying she would have it as outpatient  Medications include: amlodipine, ASA 325mg , atenolol, lipitor, plavix, lasix. Pt to continue ASA and plavix perioperatively  BP (!) 156/61   Pulse 80   Temp 36.6 C   Resp 18   Ht 5\' 2"  (1.575 m)   Wt 157 lb 12.8 oz (71.6 kg)   SpO2 97%   BMI 28.86 kg/m   Preoperative labs reviewed.    EKG 11/10/17: Sinus rhythm. Nonspecific T wave abnormality  Nuclear stress test 11/16/17: pending.   Echo 11/14/17 [facility] cardiovascular): 1.  LV cavity normal in size.  Mild concentric LVH.  Normal global wall motion.  Calculated EF 64%. 2.  LA cavity severely dilated.  Aneurysmal intra-atrial septum without PFO. 3.  Mild aortic regurgitation. 4.  Mild to moderate mitral regurgitation. 5.  Moderate tricuspid regurgitation.  Mild pulmonary hypertension.  Estimated pulmonary artery systolic pressure 42 mmHg.  CT angio head and neck 11/10/17:  HEAD:  1. No acute intracranial abnormality identified. 2. ASPECTS is 10. 3. Few nonspecific punctate calcifications within sulci over convexities, probably sequelae of old infectious or inflammatory Process.  NECK:  1. Left subclavian artery origin moderate to severe 70% stenosis with dense calcified plaque. Accurate assessment of luminal  stenosis suboptimal due to dense calcification. 2. Right proximal ICA severe greater than 70% stenosis with severe mixed plaque including dense calcification. 3. Left proximal ICA mild to moderate 50% stenosis with severe predominantly calcified plaque. 4. Patent bilateral vertebral arteries. No evidence for dissection or aneurysm. 5. Severe cervical spine degenerative changes. 6. Clustered nodules in right upper lobe and superior segment of right lower lobe probably representing infectious bronchiolitis. The largest nodular opacity measures 15 mm. Non-contrast chest CT at 3-6 months is recommended.   If no changes, I anticipate pt can proceed with surgery as scheduled.   Willeen Cass, FNP-BC [facility] Short Stay Surgical Center/Anesthesiology Phone: [phone] 11/16/2017 5:35 PM

## 2017-11-15 ENCOUNTER — Telehealth: Payer: Self-pay | Admitting: *Deleted

## 2017-11-15 DIAGNOSIS — G459 Transient cerebral ischemic attack, unspecified: Secondary | ICD-10-CM | POA: Diagnosis not present

## 2017-11-15 DIAGNOSIS — Z01818 Encounter for other preprocedural examination: Secondary | ICD-10-CM | POA: Diagnosis not present

## 2017-11-15 DIAGNOSIS — I6523 Occlusion and stenosis of bilateral carotid arteries: Secondary | ICD-10-CM | POA: Diagnosis not present

## 2017-11-15 DIAGNOSIS — I10 Essential (primary) hypertension: Secondary | ICD-10-CM | POA: Diagnosis not present

## 2017-11-15 NOTE — Telephone Encounter (Signed)
Surgery rescheduled per Dr. Bridgett Larsson. Patient notified to be at [facility] hospital admitting at 11 am on 11/19/17. All other instructions pre-op for surgery unchanged. Follow the detailed instructions she receives from the Pre-admission department for this surgery. Verbalizes understanding.

## 2017-11-16 ENCOUNTER — Telehealth: Payer: Self-pay

## 2017-11-16 DIAGNOSIS — I10 Essential (primary) hypertension: Secondary | ICD-10-CM | POA: Diagnosis not present

## 2017-11-16 DIAGNOSIS — R002 Palpitations: Secondary | ICD-10-CM | POA: Diagnosis not present

## 2017-11-16 DIAGNOSIS — Z01810 Encounter for preprocedural cardiovascular examination: Secondary | ICD-10-CM | POA: Diagnosis not present

## 2017-11-16 NOTE — Telephone Encounter (Signed)
Dr. Einar Gip called today at 1210 to inform us that the patient was cleared for surgery on Monday. The preliminary results from stress test this morning were normal. Will send over the final report when it is complete.

## 2017-11-19 ENCOUNTER — Other Ambulatory Visit: Payer: Self-pay

## 2017-11-19 ENCOUNTER — Inpatient Hospital Stay (HOSPITAL_COMMUNITY): Payer: Medicare Other | Admitting: Certified Registered Nurse Anesthetist

## 2017-11-19 ENCOUNTER — Inpatient Hospital Stay (HOSPITAL_COMMUNITY): Payer: Medicare Other | Admitting: Emergency Medicine

## 2017-11-19 ENCOUNTER — Inpatient Hospital Stay (HOSPITAL_COMMUNITY)
Admission: RE | Admit: 2017-11-19 | Discharge: 2017-11-20 | DRG: 039 | Disposition: A | Discharge status: Home / Self Care | Payer: Medicare Other | Source: Ambulatory Visit | Attending: Vascular Surgery | Admitting: Vascular Surgery

## 2017-11-19 ENCOUNTER — Encounter (HOSPITAL_COMMUNITY): Payer: Self-pay | Admitting: Certified Registered Nurse Anesthetist

## 2017-11-19 ENCOUNTER — Encounter (HOSPITAL_COMMUNITY)
Admission: RE | Disposition: A | Discharge status: Home / Self Care | Payer: Self-pay | Source: Ambulatory Visit | Attending: Vascular Surgery

## 2017-11-19 DIAGNOSIS — Z853 Personal history of malignant neoplasm of breast: Secondary | ICD-10-CM

## 2017-11-19 DIAGNOSIS — I739 Peripheral vascular disease, unspecified: Secondary | ICD-10-CM | POA: Diagnosis not present

## 2017-11-19 DIAGNOSIS — Z87891 Personal history of nicotine dependence: Secondary | ICD-10-CM | POA: Diagnosis not present

## 2017-11-19 DIAGNOSIS — Z96653 Presence of artificial knee joint, bilateral: Secondary | ICD-10-CM | POA: Diagnosis present

## 2017-11-19 DIAGNOSIS — I6521 Occlusion and stenosis of right carotid artery: Secondary | ICD-10-CM | POA: Diagnosis not present

## 2017-11-19 DIAGNOSIS — E7800 Pure hypercholesterolemia, unspecified: Secondary | ICD-10-CM | POA: Diagnosis not present

## 2017-11-19 DIAGNOSIS — Z8673 Personal history of transient ischemic attack (TIA), and cerebral infarction without residual deficits: Secondary | ICD-10-CM

## 2017-11-19 DIAGNOSIS — G459 Transient cerebral ischemic attack, unspecified: Secondary | ICD-10-CM | POA: Diagnosis not present

## 2017-11-19 DIAGNOSIS — I6529 Occlusion and stenosis of unspecified carotid artery: Secondary | ICD-10-CM | POA: Diagnosis present

## 2017-11-19 DIAGNOSIS — I10 Essential (primary) hypertension: Secondary | ICD-10-CM | POA: Diagnosis not present

## 2017-11-19 DIAGNOSIS — Z888 Allergy status to other drugs, medicaments and biological substances status: Secondary | ICD-10-CM

## 2017-11-19 DIAGNOSIS — I6522 Occlusion and stenosis of left carotid artery: Secondary | ICD-10-CM

## 2017-11-19 HISTORY — DX: Pure hypercholesterolemia, unspecified: E78.00

## 2017-11-19 HISTORY — DX: Malignant neoplasm of unspecified site of left female breast: C50.912

## 2017-11-19 HISTORY — PX: CAROTID ENDARTERECTOMY: SUR193

## 2017-11-19 HISTORY — PX: ENDARTERECTOMY: SHX5162

## 2017-11-19 HISTORY — DX: Personal history of other medical treatment: Z92.89

## 2017-11-19 SURGERY — ENDARTERECTOMY, CAROTID
Anesthesia: General | Laterality: Right

## 2017-11-19 MED ORDER — RISAQUAD PO CAPS: 1.0000 | ORAL_CAPSULE | Freq: Every day | ORAL | Status: DC

## 2017-11-19 MED ORDER — PHENYLEPHRINE 40 MCG/ML (10ML) SYRINGE FOR IV PUSH (FOR BLOOD PRESSURE SUPPORT)
PREFILLED_SYRINGE | INTRAVENOUS | Status: AC
  Filled 2017-11-19: qty 10

## 2017-11-19 MED ORDER — FENTANYL CITRATE (PF) 250 MCG/5ML IJ SOLN
INTRAMUSCULAR | Status: AC
  Filled 2017-11-19: qty 5

## 2017-11-19 MED ORDER — DOCUSATE SODIUM 100 MG PO CAPS
100.0000 mg | ORAL_CAPSULE | Freq: Every day | ORAL | Status: DC
  Filled 2017-11-19: qty 1

## 2017-11-19 MED ORDER — PROPOFOL 10 MG/ML IV BOLUS
INTRAVENOUS | Status: AC
  Filled 2017-11-19: qty 20

## 2017-11-19 MED ORDER — LIDOCAINE HCL (PF) 1 % IJ SOLN
INTRAMUSCULAR | Status: DC | PRN
  Administered 2017-11-19: 30 mL

## 2017-11-19 MED ORDER — METOPROLOL TARTRATE 5 MG/5ML IV SOLN: 2.0000 mg | INTRAVENOUS | Status: DC | PRN

## 2017-11-19 MED ORDER — CHLORHEXIDINE GLUCONATE 4 % EX LIQD: 60.0000 mL | Freq: Once | CUTANEOUS | Status: DC

## 2017-11-19 MED ORDER — FENTANYL CITRATE (PF) 100 MCG/2ML IJ SOLN: 25.0000 ug | INTRAMUSCULAR | Status: DC | PRN

## 2017-11-19 MED ORDER — FENTANYL CITRATE (PF) 100 MCG/2ML IJ SOLN
INTRAMUSCULAR | Status: AC
  Filled 2017-11-19: qty 2

## 2017-11-19 MED ORDER — ROCURONIUM BROMIDE 10 MG/ML (PF) SYRINGE
PREFILLED_SYRINGE | INTRAVENOUS | Status: DC | PRN
  Administered 2017-11-19: 35 mg via INTRAVENOUS
  Administered 2017-11-19 (×2): 20 mg via INTRAVENOUS

## 2017-11-19 MED ORDER — OXYCODONE-ACETAMINOPHEN 5-325 MG PO TABS: 1.0000 | ORAL_TABLET | ORAL | Status: DC | PRN

## 2017-11-19 MED ORDER — SUGAMMADEX SODIUM 200 MG/2ML IV SOLN
INTRAVENOUS | Status: AC
  Filled 2017-11-19: qty 2

## 2017-11-19 MED ORDER — HEPARIN SODIUM (PORCINE) 5000 UNIT/ML IJ SOLN
INTRAMUSCULAR | Status: DC | PRN
  Administered 2017-11-19: 14:00:00

## 2017-11-19 MED ORDER — PHENYLEPHRINE HCL 10 MG/ML IJ SOLN
INTRAVENOUS | Status: DC | PRN
  Administered 2017-11-19: 30 ug/min via INTRAVENOUS

## 2017-11-19 MED ORDER — FUROSEMIDE 20 MG PO TABS
20.0000 mg | ORAL_TABLET | Freq: Every day | ORAL | Status: DC
  Administered 2017-11-19 – 2017-11-20 (×2): 20 mg via ORAL
  Filled 2017-11-19 (×2): qty 1

## 2017-11-19 MED ORDER — HEMOSTATIC AGENTS (NO CHARGE) OPTIME
TOPICAL | Status: DC | PRN
  Administered 2017-11-19: 1 via TOPICAL

## 2017-11-19 MED ORDER — LIDOCAINE 2% (20 MG/ML) 5 ML SYRINGE
INTRAMUSCULAR | Status: DC | PRN
  Administered 2017-11-19: 80 mg via INTRAVENOUS

## 2017-11-19 MED ORDER — GUAIFENESIN-DM 100-10 MG/5ML PO SYRP: 15.0000 mL | ORAL_SOLUTION | ORAL | Status: DC | PRN

## 2017-11-19 MED ORDER — GLYCOPYRROLATE 0.2 MG/ML IJ SOLN
INTRAMUSCULAR | Status: DC | PRN
  Administered 2017-11-19: 0.1 mg via INTRAVENOUS
  Administered 2017-11-19: 0.2 mg via INTRAVENOUS

## 2017-11-19 MED ORDER — MORPHINE SULFATE (PF) 4 MG/ML IV SOLN: 4.0000 mg | INTRAVENOUS | Status: DC | PRN

## 2017-11-19 MED ORDER — ONDANSETRON HCL 4 MG/2ML IJ SOLN
INTRAMUSCULAR | Status: DC | PRN
  Administered 2017-11-19: 4 mg via INTRAVENOUS

## 2017-11-19 MED ORDER — DEXTRAN 40 IN SALINE 10-0.9 % IV SOLN
INTRAVENOUS | Status: AC | PRN
  Administered 2017-11-19: 500 mL

## 2017-11-19 MED ORDER — SODIUM CHLORIDE 0.9 % IV SOLN: INTRAVENOUS | Status: DC

## 2017-11-19 MED ORDER — DEXAMETHASONE SODIUM PHOSPHATE 10 MG/ML IJ SOLN
INTRAMUSCULAR | Status: DC | PRN
  Administered 2017-11-19: 4 mg via INTRAVENOUS

## 2017-11-19 MED ORDER — LACTATED RINGERS IV SOLN
INTRAVENOUS | Status: DC
  Administered 2017-11-19: 13:00:00 via INTRAVENOUS

## 2017-11-19 MED ORDER — ACETAMINOPHEN 650 MG RE SUPP: 325.0000 mg | RECTAL | Status: DC | PRN

## 2017-11-19 MED ORDER — AMLODIPINE BESYLATE 5 MG PO TABS
2.5000 mg | ORAL_TABLET | Freq: Every day | ORAL | Status: DC
  Administered 2017-11-20: 2.5 mg via ORAL
  Filled 2017-11-19: qty 1

## 2017-11-19 MED ORDER — SODIUM CHLORIDE 0.9 % IV SOLN
INTRAVENOUS | Status: DC | PRN
  Administered 2017-11-19: .1 ug/kg/min via INTRAVENOUS

## 2017-11-19 MED ORDER — PROTAMINE SULFATE 10 MG/ML IV SOLN
INTRAVENOUS | Status: DC | PRN
  Administered 2017-11-19: 50 mg via INTRAVENOUS

## 2017-11-19 MED ORDER — CLOPIDOGREL BISULFATE 75 MG PO TABS
75.0000 mg | ORAL_TABLET | Freq: Every day | ORAL | Status: DC
Start: 2017-11-20 — End: 2017-11-20
  Administered 2017-11-20: 75 mg via ORAL
  Filled 2017-11-19: qty 1

## 2017-11-19 MED ORDER — FENTANYL CITRATE (PF) 250 MCG/5ML IJ SOLN
INTRAMUSCULAR | Status: DC | PRN
  Administered 2017-11-19 (×2): 50 ug via INTRAVENOUS

## 2017-11-19 MED ORDER — SODIUM CHLORIDE 0.9 % IV SOLN
INTRAVENOUS | Status: DC
  Administered 2017-11-19: 20:00:00 via INTRAVENOUS

## 2017-11-19 MED ORDER — PHENOL 1.4 % MT LIQD: 1.0000 | OROMUCOSAL | Status: DC | PRN

## 2017-11-19 MED ORDER — SODIUM CHLORIDE 0.9 % IV SOLN
0.0125 ug/kg/min | INTRAVENOUS | Status: DC
  Filled 2017-11-19: qty 2000

## 2017-11-19 MED ORDER — LABETALOL HCL 5 MG/ML IV SOLN
INTRAVENOUS | Status: AC
  Filled 2017-11-19: qty 4

## 2017-11-19 MED ORDER — DEXTRAN 40 IN D5W 10 % IV SOLN
INTRAVENOUS | Status: AC
  Filled 2017-11-19: qty 500

## 2017-11-19 MED ORDER — LACTATED RINGERS IV SOLN
INTRAVENOUS | Status: DC | PRN
  Administered 2017-11-19: 14:00:00 via INTRAVENOUS

## 2017-11-19 MED ORDER — SODIUM CHLORIDE 0.9 % IV SOLN
500.0000 mL | Freq: Once | INTRAVENOUS | Status: AC | PRN
  Administered 2017-11-19: 500 mL via INTRAVENOUS

## 2017-11-19 MED ORDER — LABETALOL HCL 5 MG/ML IV SOLN
INTRAVENOUS | Status: DC | PRN
  Administered 2017-11-19 (×2): 10 mg via INTRAVENOUS

## 2017-11-19 MED ORDER — RISAQUAD PO CAPS
1.0000 | ORAL_CAPSULE | Freq: Every day | ORAL | Status: DC
  Administered 2017-11-19 – 2017-11-20 (×2): 1 via ORAL
  Filled 2017-11-19 (×2): qty 1

## 2017-11-19 MED ORDER — PANTOPRAZOLE SODIUM 40 MG PO TBEC
40.0000 mg | DELAYED_RELEASE_TABLET | Freq: Every day | ORAL | Status: DC
  Administered 2017-11-19 – 2017-11-20 (×2): 40 mg via ORAL
  Filled 2017-11-19 (×2): qty 1

## 2017-11-19 MED ORDER — LABETALOL HCL 5 MG/ML IV SOLN: 10.0000 mg | INTRAVENOUS | Status: DC | PRN

## 2017-11-19 MED ORDER — ATORVASTATIN CALCIUM 80 MG PO TABS: 80.0000 mg | ORAL_TABLET | Freq: Every day | ORAL | Status: DC

## 2017-11-19 MED ORDER — RED YEAST RICE 600 MG PO TABS: 600.0000 mg | ORAL_TABLET | Freq: Every day | ORAL | Status: DC

## 2017-11-19 MED ORDER — ONDANSETRON HCL 4 MG/2ML IJ SOLN
INTRAMUSCULAR | Status: AC
  Filled 2017-11-19: qty 2

## 2017-11-19 MED ORDER — ALUM & MAG HYDROXIDE-SIMETH 200-200-20 MG/5ML PO SUSP: 15.0000 mL | ORAL | Status: DC | PRN

## 2017-11-19 MED ORDER — HYDRALAZINE HCL 20 MG/ML IJ SOLN: 5.0000 mg | INTRAMUSCULAR | Status: DC | PRN

## 2017-11-19 MED ORDER — POTASSIUM CHLORIDE CRYS ER 20 MEQ PO TBCR: 20.0000 meq | EXTENDED_RELEASE_TABLET | Freq: Every day | ORAL | Status: DC | PRN

## 2017-11-19 MED ORDER — PROPOFOL 10 MG/ML IV BOLUS
INTRAVENOUS | Status: DC | PRN
  Administered 2017-11-19: 120 mg via INTRAVENOUS

## 2017-11-19 MED ORDER — PROTAMINE SULFATE 10 MG/ML IV SOLN
INTRAVENOUS | Status: AC
  Filled 2017-11-19: qty 10

## 2017-11-19 MED ORDER — CHOLECALCIFEROL 10 MCG (400 UNIT) PO TABS
400.0000 [IU] | ORAL_TABLET | Freq: Two times a day (BID) | ORAL | Status: DC
  Administered 2017-11-19: 400 [IU] via ORAL
  Filled 2017-11-19 (×2): qty 1

## 2017-11-19 MED ORDER — HEPARIN SODIUM (PORCINE) 1000 UNIT/ML IJ SOLN
INTRAMUSCULAR | Status: DC | PRN
  Administered 2017-11-19: 2000 [IU] via INTRAVENOUS
  Administered 2017-11-19: 7500 [IU] via INTRAVENOUS

## 2017-11-19 MED ORDER — CEFAZOLIN SODIUM-DEXTROSE 2-4 GM/100ML-% IV SOLN
2.0000 g | Freq: Three times a day (TID) | INTRAVENOUS | Status: AC
  Administered 2017-11-19 – 2017-11-20 (×2): 2 g via INTRAVENOUS
  Filled 2017-11-19 (×3): qty 100

## 2017-11-19 MED ORDER — 0.9 % SODIUM CHLORIDE (POUR BTL) OPTIME
TOPICAL | Status: DC | PRN
  Administered 2017-11-19: 2000 mL

## 2017-11-19 MED ORDER — LIDOCAINE HCL (CARDIAC) 20 MG/ML IV SOLN
INTRAVENOUS | Status: AC
  Filled 2017-11-19: qty 5

## 2017-11-19 MED ORDER — DEXAMETHASONE SODIUM PHOSPHATE 10 MG/ML IJ SOLN
INTRAMUSCULAR | Status: AC
  Filled 2017-11-19: qty 1

## 2017-11-19 MED ORDER — ASPIRIN EC 325 MG PO TBEC
325.0000 mg | DELAYED_RELEASE_TABLET | Freq: Every day | ORAL | Status: DC
  Administered 2017-11-20: 325 mg via ORAL
  Filled 2017-11-19: qty 1

## 2017-11-19 MED ORDER — ACETAMINOPHEN 325 MG PO TABS: 325.0000 mg | ORAL_TABLET | ORAL | Status: DC | PRN

## 2017-11-19 MED ORDER — ONDANSETRON HCL 4 MG/2ML IJ SOLN: 4.0000 mg | Freq: Four times a day (QID) | INTRAMUSCULAR | Status: DC | PRN

## 2017-11-19 MED ORDER — LIDOCAINE HCL (PF) 1 % IJ SOLN
INTRAMUSCULAR | Status: AC
  Filled 2017-11-19: qty 30

## 2017-11-19 MED ORDER — MAGNESIUM SULFATE 2 GM/50ML IV SOLN: 2.0000 g | Freq: Every day | INTRAVENOUS | Status: DC | PRN

## 2017-11-19 MED ORDER — ATENOLOL 25 MG PO TABS
50.0000 mg | ORAL_TABLET | Freq: Every day | ORAL | Status: DC
  Administered 2017-11-20: 50 mg via ORAL
  Filled 2017-11-19: qty 2

## 2017-11-19 MED ORDER — ALPRAZOLAM 0.25 MG PO TABS
0.1250 mg | ORAL_TABLET | Freq: Every day | ORAL | Status: DC
  Filled 2017-11-19: qty 1

## 2017-11-19 MED ORDER — CEFAZOLIN SODIUM-DEXTROSE 2-4 GM/100ML-% IV SOLN
2.0000 g | INTRAVENOUS | Status: AC
  Administered 2017-11-19: 2 g via INTRAVENOUS
  Filled 2017-11-19: qty 100

## 2017-11-19 SURGICAL SUPPLY — 58 items
ADH SKN CLS APL DERMABOND .7 (GAUZE/BANDAGES/DRESSINGS) ×1
ADPR TBG 2 MALE LL ART (MISCELLANEOUS)
AGENT HMST SPONGE THK3/8 (HEMOSTASIS) ×1
BAG DECANTER FOR FLEXI CONT (MISCELLANEOUS) ×3 IMPLANT
BIOPATCH RED 1 DISK 7.0 (GAUZE/BANDAGES/DRESSINGS) ×1 IMPLANT
CANISTER SUCT 3000ML PPV (MISCELLANEOUS) ×2 IMPLANT
CATH ROBINSON RED A/P 18FR (CATHETERS) ×2 IMPLANT
CLIP VESOCCLUDE MED 24/CT (CLIP) ×2 IMPLANT
CLIP VESOCCLUDE SM WIDE 24/CT (CLIP) ×2 IMPLANT
COVER PROBE W GEL 5X96 (DRAPES) ×2 IMPLANT
CRADLE DONUT ADULT HEAD (MISCELLANEOUS) ×2 IMPLANT
DERMABOND ADVANCED (GAUZE/BANDAGES/DRESSINGS) ×1
DERMABOND ADVANCED .7 DNX12 (GAUZE/BANDAGES/DRESSINGS) ×1 IMPLANT
DRSG TEGADERM 2-3/8X2-3/4 SM (GAUZE/BANDAGES/DRESSINGS) ×1 IMPLANT
ELECT REM PT RETURN 9FT ADLT (ELECTROSURGICAL) ×2
ELECTRODE REM PT RTRN 9FT ADLT (ELECTROSURGICAL) ×1 IMPLANT
GAUZE SPONGE 4X4 16PLY XRAY LF (GAUZE/BANDAGES/DRESSINGS) ×1 IMPLANT
GLOVE BIO SURGEON STRL SZ 6.5 (GLOVE) ×2 IMPLANT
GLOVE BIO SURGEON STRL SZ7 (GLOVE) ×3 IMPLANT
GLOVE BIO SURGEON STRL SZ7.5 (GLOVE) ×1 IMPLANT
GLOVE BIOGEL PI IND STRL 7.0 (GLOVE) IMPLANT
GLOVE BIOGEL PI IND STRL 7.5 (GLOVE) ×1 IMPLANT
GLOVE BIOGEL PI INDICATOR 7.0 (GLOVE) ×2
GLOVE BIOGEL PI INDICATOR 7.5 (GLOVE) ×1
GOWN STRL REUS W/ TWL LRG LVL3 (GOWN DISPOSABLE) ×3 IMPLANT
GOWN STRL REUS W/ TWL XL LVL3 (GOWN DISPOSABLE) IMPLANT
GOWN STRL REUS W/TWL LRG LVL3 (GOWN DISPOSABLE) ×8
GOWN STRL REUS W/TWL XL LVL3 (GOWN DISPOSABLE) ×2
HEMOSTAT SPONGE AVITENE ULTRA (HEMOSTASIS) ×1 IMPLANT
IV ADAPTER SYR DOUBLE MALE LL (MISCELLANEOUS) IMPLANT
KIT BASIN OR (CUSTOM PROCEDURE TRAY) ×2 IMPLANT
KIT ROOM TURNOVER OR (KITS) ×2 IMPLANT
KIT SHUNT ARGYLE CAROTID ART 6 (VASCULAR PRODUCTS) ×1 IMPLANT
NDL HYPO 25GX1X1/2 BEV (NEEDLE) IMPLANT
NEEDLE HYPO 25GX1X1/2 BEV (NEEDLE) ×2 IMPLANT
NS IRRIG 1000ML POUR BTL (IV SOLUTION) ×6 IMPLANT
PACK CAROTID (CUSTOM PROCEDURE TRAY) ×2 IMPLANT
PAD ARMBOARD 7.5X6 YLW CONV (MISCELLANEOUS) ×4 IMPLANT
PATCH VASC XENOSURE 1CMX6CM (Vascular Products) ×2 IMPLANT
PATCH VASC XENOSURE 1X6 (Vascular Products) ×1 IMPLANT
SET COLLECT BLD 21X3/4 12 PB (MISCELLANEOUS) IMPLANT
SHUNT CAROTID BYPASS 10 (VASCULAR PRODUCTS) IMPLANT
SHUNT CAROTID BYPASS 12FRX15.5 (VASCULAR PRODUCTS) IMPLANT
SPONGE INTESTINAL PEANUT (DISPOSABLE) ×1 IMPLANT
SUT ETHILON 3 0 PS 1 (SUTURE) IMPLANT
SUT MNCRL AB 4-0 PS2 18 (SUTURE) ×2 IMPLANT
SUT PROLENE 6 0 BV (SUTURE) ×9 IMPLANT
SUT PROLENE 7 0 BV 1 (SUTURE) IMPLANT
SUT SILK 3 0 (SUTURE) ×2
SUT SILK 3-0 18XBRD TIE 12 (SUTURE) IMPLANT
SUT VIC AB 3-0 SH 27 (SUTURE) ×2
SUT VIC AB 3-0 SH 27X BRD (SUTURE) ×1 IMPLANT
SYR 20CC LL (SYRINGE) ×2 IMPLANT
SYR TB 1ML LUER SLIP (SYRINGE) ×1 IMPLANT
SYSTEM CHEST DRAIN TLS 7FR (DRAIN) ×1 IMPLANT
TOWEL GREEN STERILE (TOWEL DISPOSABLE) ×2 IMPLANT
TUBING ART PRESS 48 MALE/FEM (TUBING) IMPLANT
WATER STERILE IRR 1000ML POUR (IV SOLUTION) ×2 IMPLANT

## 2017-11-19 NOTE — Progress Notes (Signed)
Patient arrived from PACU after right CEA. Telemetry monitor applied and CCMD notified.  Patient oriented to unit and room to include call light and phone.  Will continue to monitor.

## 2017-11-19 NOTE — Progress Notes (Signed)
SCD machine ordered at this time. Admission Nurse at bedside & family at bedside. Will continue to monitor.

## 2017-11-19 NOTE — Transfer of Care (Signed)
Immediate Anesthesia Transfer of Care Note  Patient: [name]  Procedure(s) Performed: ENDARTERECTOMY CAROTID RIGHT (Right )  Patient Location: PACU  Anesthesia Type:General  Level of Consciousness: awake and alert   Airway & Oxygen Therapy: Patient Spontanous Breathing and Patient connected to nasal cannula oxygen  Post-op Assessment: Report given to RN, Post -op Vital signs reviewed and stable, Patient moving all extremities X 4 and Patient able to stick tongue midline  Post vital signs: Reviewed and stable  Last Vitals:  Vitals:   11/19/17 1149 11/19/17 1711  BP: (!) 166/46 119/60  Pulse: 66 79  Resp: 20 17  Temp: 36.7 C (!) 36.3 C  SpO2: 100% 97%    Last Pain:  Vitals:   11/19/17 1149  TempSrc: Oral      Patients Stated Pain Goal: 2 (01/00/71 2197)  Complications: No apparent anesthesia complications

## 2017-11-19 NOTE — Op Note (Signed)
OPERATIVE NOTE  PROCEDURE:   1.  right carotid endarterectomy with bovine patch angioplasty 2.  right intraoperative carotid ultrasound  PRE-OPERATIVE DIAGNOSIS: right symptomatic carotid stenosis >90%  POST-OPERATIVE DIAGNOSIS: same as above   SURGEON: [name], MD  ASSISTANT(S): Dagoberto Ligas, PAC   ANESTHESIA: general  ESTIMATED BLOOD LOSS: 300 cc  FINDING(S): 1.  Densely adherent subcutaneous tissue overlying carotid arteries 2.  Transposition of position of internal carotid artery and external carotid artery 3.  Continuous Doppler audible flow signatures are appropriate for each carotid artery. 4.  No evidence of intimal flap visualized on transverse or longitudinal ultrasonography. 5.  Carotid plaque: necrotic core, near occluded, heavily calcified 6.  Vagus nerve: not visualized normal position 7.  Hypoglossal nerve: not visualized, did not require mobilization  SPECIMEN(S):  None  INDICATIONS:   [name] is a 84 y.o. female who presents with right symptomatic carotid stenosis >90%.  I discussed with the patient the risks, benefits, and alternatives to carotid endarterectomy.  I discussed the procedural details of carotid endarterectomy with the patient.  The patient is aware that the risks of carotid endarterectomy include but are not limited to: bleeding, infection, stroke, myocardial infarction, death, cranial nerve injuries both temporary and permanent, neck hematoma, possible airway compromise, labile blood pressure post-operatively, cerebral hyperperfusion syndrome, and possible need for additional interventions in the future. The patient is aware of the risks and agrees to proceed forward with the procedure.   DESCRIPTION: After full informed written consent was obtained from the patient, the patient was brought back to the operating room and placed supine upon the operating table.  Prior to induction, the patient received IV antibiotics.  After  obtaining adequate anesthesia, the patient was placed into semi-Fowler position with a shoulder roll in place and the patient's neck slightly hyperextended and rotated away from the surgical site.    The patient was prepped in the standard fashion for a right carotid endarterectomy.  I made an incision anterior to the sternocleidomastoid muscle and dissected down through the subcutaneous tissue.  The platysmas was opened with electrocautery.  Then I dissected down to the internal jugular vein.  This was dissected posteriorly until I obtained visualization of the common carotid artery.  This was dissected out and then an umbilical tape was placed around the common carotid artery and I loosely applied a Rumel tourniquet.  The patient was given 7500 units of Heparin intravenously, which was a therapeutic bolus. An additional 2000 units of Heparin was administered every hour after initial bolus to maintain anticoagulation.  In total, 9500 units of Heparin was administrated to achieve and maintain a therapeutic level of anticoagulation.  The dissection of the rest of the carotid artery was difficult due to dense adherent subcutaneous tissue overlying the carotid artery.  I then dissected in a periadventitial fashion along the common carotid artery up to the bifurcation.  I was evident that the normal position of the external carotid artery and internal carotid artery were transposed with the internal carotid artery in the anterior position to the external carotid artery.  I identified the external carotid artery by finding the superior thyroid artery.  A 2-0 silk tie was looped around the superior thyroid artery, and I also dissected out the external carotid artery and placed a vessel loop around it.    In continuing the dissection to the internal carotid artery, I identified a web of veins overlying the external carotid artery and internal carotid artery.  These veins was ligated and then transected, giving me  improved exposure of the internal carotid artery.  In the process of this dissection, the ansa cervicalis was identified, but I never definitively identified the hypoglossal nerve.  Following the ansa cervicalis suggested the hypoglossal was more superior than dissected.    I then dissected out the internal carotid artery until I identified an area of soft tissue in the internal carotid artery.  I dissected slightly distal to this area, and placed an umbilical tape around the artery and loosely applied a Rumel tourniquet.  I clamped the internal carotid artery, external carotid artery and then the common carotid artery.  I then made an arteriotomy in the common carotid artery with a 11 blade, and extended the arteriotomy with a Potts scissor down into the common carotid artery, then I carried the arteriotomy toward the bifurcation and shortly into the internal carotid artery.  This artery was so calcified, the Potts scissor could not cut the wall.  I took a 15 blade and score the anterior wall.  I then carried the arteriotomy superior to the calcified lesion.  I could not clearly re-enter; however, so I had to do a rapid endarterectomy prior to shunt placement.  I rapidly performed the endarterectomy, getting a good end-point in the internal carotid artery where the disease feather into a densely adherent rim of disease.    At this point, I took the 10 shunt that previously been prepared and I inserted it into the internal carotid artery.  The Rumel tourniquet was then applied to this end of the shunt.  I unclamped the shunt to verify retrograde blood flow in the internal carotid artery.  I then placed the other end of the shunt into the common carotid artery after unclamping the artery.  The Rumel was tightened down around the shunt.  At this point, I verified blood flow in the shunt with a continuous doppler.     At this point, I spent the next 30 minutes removing intimal flaps and loose debris.  Eventually  I reached the point where the residual plaque was densely adherent and any further dissection would compromise the integrity of the wall.  The wall superiorly was especially attenuated, likely due to the heavily calcified lesion.  After verifying that there was no more loose intimal flaps or debris, I re-interrogated the entirety of this carotid artery.  At this point, I was satisfied that the minimal remaining disease was densely adherent to the wall and wall integrity was intact.  At this point, I then fashioned a bovine pericardial patch for the geometry of this artery and sewed it in place with two running stitch of 6-0 Prolene, one from each end.  Prior to completing this patch angioplasty, I removed the shunt first from the internal carotid artery, from which there was excellent backbleeding, and clamped it.  Then I removed the shunt from the common carotid artery, from which there was excellent antegrade bleeding, and then clamped it.  At this point, I allowed the external carotid artery to backbleed, which was excellent.  Then I instilled heparinized saline in this patched artery and then completed the patch angioplasty in the usual fashion.    At this point, I first released the clamp on the external carotid artery, then I released it on the common carotid artery.  After waiting a few seconds, I then released it on the internal carotid artery.   I then interrogated this patient's arteries with the  continuous Doppler.  The audible waveforms in each artery were consistent with the expected characteristics for each artery.  The Sonosite probe was then sterilely draped and used to interrogate the carotid artery in both longitudinal and transverse views.  At this point, I washed out the wound, and placed Avitene throughout.  I also gave the patient 50 mg of protamine to reverse his anticoagulation.   After waiting a few minutes, I removed the Avitene and washed out the wound.  There was no more active  bleeding in the surgical site.     I then reapproximated the platysma muscle with a running stitch of 3-0 Vicryl.  The skin was then reapproximated with a running subcuticular 4-0 Monocryl stitch.  The skin was then cleaned, dried and LiquidBand was used to reinforce the skin closure.    The patient woke without any problems, neurologically intact.    COMPLICATIONS: none  CONDITION: stable   [name], MD, [facility] [place] Office: [phone] Pager: [phone]  11/19/2017, 4:47 PM

## 2017-11-19 NOTE — Anesthesia Procedure Notes (Signed)
Arterial Line Insertion Start/End3/07/2018 1:30 PM, 11/19/2017 1:32 PM Performed by: Lavell Luster, CRNA, CRNA  Patient location: Pre-op. Preanesthetic checklist: patient identified, IV checked, site marked, risks and benefits discussed, surgical consent, monitors and equipment checked, pre-op evaluation, timeout performed and anesthesia consent Lidocaine 1% used for infiltration Right, radial was placed Catheter size: 20 G Hand hygiene performed  and maximum sterile barriers used  Allen's test indicative of satisfactory collateral circulation Attempts: 1 Procedure performed without using ultrasound guided technique. Ultrasound Notes:anatomy identified Following insertion, dressing applied and Biopatch. Post procedure assessment: normal  Patient tolerated the procedure well with no immediate complications.

## 2017-11-19 NOTE — Interval H&P Note (Signed)
History and Physical Interval Note:  11/19/2017 11:46 AM  [name]  has presented today for surgery, with the diagnosis of RIGHT CAROTID STENOSIS  The various methods of treatment have been discussed with the patient and family. After consideration of risks, benefits and other options for treatment, the patient has consented to  Procedure(s): ENDARTERECTOMY CAROTID RIGHT (Right) as a surgical intervention .  The patient's history has been reviewed, patient examined, no change in status, stable for surgery.  I have reviewed the patient's chart and labs.  Questions were answered to the patient's satisfaction.     [name]

## 2017-11-19 NOTE — Anesthesia Preprocedure Evaluation (Addendum)
Anesthesia Evaluation  Patient identified by MRN, date of birth, ID band Patient awake    Reviewed: Allergy & Precautions, NPO status , Patient's Chart, lab work & pertinent test results  Airway Mallampati: II  TM Distance: <3 FB Neck ROM: Full    Dental  (+) Teeth Intact, Chipped, Dental Advisory Given,    Pulmonary former smoker,    breath sounds clear to auscultation       Cardiovascular hypertension, + Peripheral Vascular Disease   Rhythm:Regular Rate:Normal     Neuro/Psych TIA   GI/Hepatic negative GI ROS, Neg liver ROS,   Endo/Other  negative endocrine ROS  Renal/GU negative Renal ROS     Musculoskeletal  (+) Arthritis ,   Abdominal   Peds  Hematology negative hematology ROS (+)   Anesthesia Other Findings   Reproductive/Obstetrics                            Anesthesia Physical Anesthesia Plan  ASA: III  Anesthesia Plan: General   Post-op Pain Management:    Induction: Intravenous  PONV Risk Score and Plan: 4 or greater and Treatment may vary due to age or medical condition, Dexamethasone and Ondansetron  Airway Management Planned: Oral ETT  Additional Equipment: Arterial line  Intra-op Plan:   Post-operative Plan: Extubation in OR  Informed Consent: I have reviewed the patients History and Physical, chart, labs and discussed the procedure including the risks, benefits and alternatives for the proposed anesthesia with the patient or authorized representative who has indicated his/her understanding and acceptance.   Dental advisory given  Plan Discussed with: CRNA  Anesthesia Plan Comments:         Anesthesia Quick Evaluation

## 2017-11-19 NOTE — Anesthesia Procedure Notes (Signed)
Procedure Name: Intubation Date/Time: 11/19/2017 2:07 PM Performed by: Inda Coke, CRNA Pre-anesthesia Checklist: Patient identified, Emergency Drugs available, Suction available and Patient being monitored Patient Re-evaluated:Patient Re-evaluated prior to induction Oxygen Delivery Method: Circle System Utilized Preoxygenation: Pre-oxygenation with 100% oxygen Induction Type: IV induction Ventilation: Mask ventilation without difficulty Tube type: Oral Number of attempts: 1 Airway Equipment and Method: Stylet and Oral airway Placement Confirmation: ETT inserted through vocal cords under direct vision,  positive ETCO2 and breath sounds checked- equal and bilateral Secured at: 21 cm Tube secured with: Tape Dental Injury: Teeth and Oropharynx as per pre-operative assessment  Comments: DL X 1 with MAC 3 blade. Grade IV view. Dr. Orene Desanctis performed blind intubation with +etco2, bilateral breath sounds present and equal.

## 2017-11-20 ENCOUNTER — Encounter (HOSPITAL_COMMUNITY): Payer: Self-pay | Admitting: Vascular Surgery

## 2017-11-20 ENCOUNTER — Telehealth: Payer: Self-pay | Admitting: Vascular Surgery

## 2017-11-20 DIAGNOSIS — I6521 Occlusion and stenosis of right carotid artery: Secondary | ICD-10-CM

## 2017-11-20 LAB — BASIC METABOLIC PANEL
Anion gap: 7 (ref 5–15)
BUN: 14 mg/dL (ref 6–20)
CO2: 23 mmol/L (ref 22–32)
Calcium: 7.9 mg/dL — ABNORMAL LOW (ref 8.9–10.3)
Chloride: 108 mmol/L (ref 101–111)
Creatinine, Ser: 0.68 mg/dL (ref 0.44–1.00)
Glucose, Bld: 131 mg/dL — ABNORMAL HIGH (ref 65–99)
POTASSIUM: 3.8 mmol/L (ref 3.5–5.1)
SODIUM: 138 mmol/L (ref 135–145)

## 2017-11-20 LAB — CBC
HCT: 33.9 % — ABNORMAL LOW (ref 36.0–46.0)
HEMOGLOBIN: 10.8 g/dL — AB (ref 12.0–15.0)
MCH: 29 pg (ref 26.0–34.0)
MCHC: 31.9 g/dL (ref 30.0–36.0)
MCV: 90.9 fL (ref 78.0–100.0)
PLATELETS: 126 10*3/uL — AB (ref 150–400)
RBC: 3.73 MIL/uL — AB (ref 3.87–5.11)
RDW: 14.1 % (ref 11.5–15.5)
WBC: 6.9 10*3/uL (ref 4.0–10.5)

## 2017-11-20 MED ORDER — OXYCODONE-ACETAMINOPHEN 5-325 MG PO TABS: 1.0000 | ORAL_TABLET | Freq: Four times a day (QID) | ORAL | 0 refills | Status: DC | PRN

## 2017-11-20 NOTE — Telephone Encounter (Signed)
-----   Message from Mena Goes, RN sent at 11/20/2017  8:44 AM EDT ----- Regarding: 2-3 weeks post CEA   ----- Message ----- From: Ulyses Amor, PA-C Sent: 11/20/2017   7:24 AM To: Vvs Charge Pool  S/P left CEA f/u with Dr. Bridgett Larsson in 2-3 weeks

## 2017-11-20 NOTE — Discharge Instructions (Signed)
   [facility] [place]  Discharge Instructions   Carotid Endarterectomy (CEA)  Please refer to the following instructions for your post-procedure care. Your surgeon or physician assistant will discuss any changes with you.  Activity  You are encouraged to walk as much as you can. You can slowly return to normal activities but must avoid strenuous activity and heavy lifting until your doctor tell you it's OK. Avoid activities such as vacuuming or swinging a golf club. You can drive after one week if you are comfortable and you are no longer taking prescription pain medications. It is normal to feel tired for serval weeks after your surgery. It is also normal to have difficulty with sleep habits, eating, and bowel movements after surgery. These will go away with time.  Bathing/Showering  You may shower after you come home. Do not soak in a bathtub, hot tub, or swim until the incision heals completely.  Incision Care  Shower every day. Clean your incision with mild soap and water. Pat the area dry with a clean towel. You do not need a bandage unless otherwise instructed. Do not apply any ointments or creams to your incision. You may have skin glue on your incision. Do not peel it off. It will come off on its own in about one week. Your incision may feel thickened and raised for several weeks after your surgery. This is normal and the skin will soften over time. For Men Only: It's OK to shave around the incision but do not shave the incision itself for 2 weeks. It is common to have numbness under your chin that could last for several months.  Diet  Resume your normal diet. There are no special food restrictions following this procedure. A low fat/low cholesterol diet is recommended for all patients with vascular disease. In order to heal from your surgery, it is CRITICAL to get adequate nutrition. Your body requires vitamins, minerals, and protein. Vegetables are the best  source of vitamins and minerals. Vegetables also provide the perfect balance of protein. Processed food has little nutritional value, so try to avoid this.        Medications  Resume taking all of your medications unless your doctor or physician assistant tells you not to. If your incision is causing pain, you may take over-the- counter pain relievers such as acetaminophen (Tylenol). If you were prescribed a stronger pain medication, please be aware these medications can cause nausea and constipation. Prevent nausea by taking the medication with a snack or meal. Avoid constipation by drinking plenty of fluids and eating foods with a high amount of fiber, such as fruits, vegetables, and grains. Do not take Tylenol if you are taking prescription pain medications.  Follow Up  Our office will schedule a follow up appointment 2-3 weeks following discharge.  Please call us immediately for any of the following conditions  Increased pain, redness, drainage (pus) from your incision site. Fever of 101 degrees or higher. If you should develop stroke (slurred speech, difficulty swallowing, weakness on one side of your body, loss of vision) you should call 911 and go to the nearest emergency room.  Reduce your risk of vascular disease:  Stop smoking. If you would like help call [facility] at 1-800-QUIT-NOW (1-800-784-8669) or [place] at 336-586-4000. Manage your cholesterol Maintain a desired weight Control your diabetes Keep your blood pressure down  If you have any questions, please call the office at [phone].   

## 2017-11-20 NOTE — Progress Notes (Addendum)
[facility] [place]  Subjective  - Doing very well.   Objective (!) 126/50 80 98.8 F (37.1 C) (Oral) (!) 21 95%  Intake/Output Summary (Last 24 hours) at 11/20/2017 0718 Last data filed at 11/20/2017 0500 Gross per 24 hour  Intake 2925 ml  Output 1765 ml  Net 1160 ml    Right neck incision  No tongue deviation  Smile symmetric, no mandibular neuropraxia Grip 5/5, moving all 4 ext.  Heart RRR with few PVC's Lungs non labored breathing  Assessment/Planning: POD # 1 left CEA  Voided, tolerating PO's with slight sore throat secondary to intubation. Plan for discharge this am Disposition stable F/U with Dr. Bridgett Larsson in 2 weeks    [name] 11/20/2017 7:18 AM --  Laboratory Lab Results: Recent Labs    11/20/17 0300  WBC 6.9  HGB 10.8*  HCT 33.9*  PLT 126*   BMET Recent Labs    11/20/17 0300  NA 138  K 3.8  CL 108  CO2 23  GLUCOSE 131*  BUN 14  CREATININE 0.68  CALCIUM 7.9*    COAG Lab Results  Component Value Date   INR 0.98 11/13/2017   INR 0.97 11/10/2017   INR 1.77 (H) 02/24/2010   No results found for: PTT   Addendum  I have independently interviewed and examined the patient, and I agree with the physician assistant's findings.  No hematoma in neck.  TLS drain out.  Mild echymosis along incision.  Neuro intact.  Minimal headache.  Hemodynamics ok.  No evidence of hyperperfusion at this point.  - Ok to D/C - F/U in 2 weeks   [name], MD, FACS [facility] [place] Office: [phone] Pager: [phone]  11/20/2017, 8:49 AM

## 2017-11-20 NOTE — Telephone Encounter (Signed)
Left pt vm re 12/19/17 appt. Mailed letter.

## 2017-11-20 NOTE — Care Management Note (Signed)
Case Management Note Marvetta Gibbons RN, BSN Unit 4E-Case Manager [phone]  Patient Details  Name: [name] MRN: [id number] Date of Birth: November 29, 1930  Subjective/Objective:  Pt admitted s/p CEA                  Action/Plan: PTA pt lived at home- plan to return home- no CM needs noted for transition home   Expected Discharge Date:  11/20/17               Expected Discharge Plan:  Home/Self Care  In-House Referral:  NA  Discharge planning Services  CM Consult  Post Acute Care Choice:    Choice offered to:     DME Arranged:    DME Agency:     HH Arranged:    [place] Agency:     Status of Service:  Completed, signed off  If discussed at [employer] of Stay Meetings, dates discussed:    Additional Comments:  Dawayne Patricia, RN 11/20/2017, 12:18 PM

## 2017-11-20 NOTE — Discharge Summary (Addendum)
Discharge Summary     [name] 1931/03/08 84 y.o. female  [id number]  Admission Date: 11/19/2017  Discharge Date: 11/20/17  Physician: Conrad Manitou, MD  Admission Diagnosis: RIGHT CAROTID STENOSIS  Discharge Day services:    see progress note 11/20/17 Physical Exam: Vitals:   11/20/17 0123 11/20/17 0502  BP: 114/68 (!) 126/50  Pulse: 90 80  Resp: (!) 24 (!) 21  Temp: 99.1 F (37.3 C) 98.8 F (37.1 C)  SpO2: 95% 95%    Hospital Course:  The patient was admitted to the hospital and taken to the operating room on 11/19/2017 and underwent L carotid endarterectomy.  The pt tolerated the procedure well and was transported to the PACU in good condition.   By POD 1, the pt neuro status remains at baseline.   The remainder of the hospital course consisted of increasing mobilization and increasing intake of solids without difficulty.  Drain was removed POD#1.  She will follow up in office in about 2 weeks.  She will be prescribed 2 days of narcotic pain medication for continued post operative pain control.  Discharge instructions were reviewed with the patient and she voices her understanding.  She will be discharged this morning in stable condition.   Recent Labs    11/20/17 0300  NA 138  K 3.8  CL 108  CO2 23  GLUCOSE 131*  BUN 14  CALCIUM 7.9*   Recent Labs    11/20/17 0300  WBC 6.9  HGB 10.8*  HCT 33.9*  PLT 126*   No results for input(s): INR in the last 72 hours.   Discharge Instructions    Discharge patient   Complete by:  As directed    Discharge disposition:  01-Home or Self Care   Discharge patient date:  11/20/2017      Discharge Diagnosis:  RIGHT CAROTID STENOSIS  Secondary Diagnosis: Patient Active Problem List   Diagnosis Date Noted  . Carotid artery stenosis 11/19/2017  . HTN (hypertension) 11/11/2017  . Carotid stenosis 11/11/2017  . TIA (transient ischemic attack) 11/10/2017  . History of breast cancer 11/13/2011    Past Medical History:  Diagnosis Date  . Arthritis    "all the joints I've had replaced; some in my hands probably" (11/19/2017)  . Breast cancer, left breast ([place]) 2000  . Carotid stenosis, right   . High cholesterol   . History of blood transfusion    "w/both knee surgeries" (11/19/2017)  . Hypertension   . Pneumonia    "had it as a child"  . TIA (transient ischemic attack) 11/10/2017    Allergies as of 11/20/2017      Reactions   Lisinopril Cough      Medication List    TAKE these medications   alendronate 70 MG tablet Commonly known as:  FOSAMAX Take 70 mg by mouth every 14 (fourteen) days. Take with a full glass of water on an empty stomach.   ALPRAZolam 0.25 MG tablet Commonly known as:  XANAX Take 0.125 mg by mouth at bedtime.   amLODipine 2.5 MG tablet Commonly known as:  NORVASC Take 2.5 mg by mouth daily.   aspirin 325 MG EC tablet Take 1 tablet (325 mg total) by mouth daily.   atenolol 50 MG tablet Commonly known as:  TENORMIN Take 50 mg by mouth daily.   atorvastatin 80 MG tablet Commonly known as:  LIPITOR Take 1 tablet (80 mg total) by mouth daily at 6 PM.   calcium carbonate 1250 (  500 Ca) MG tablet Commonly known as:  OS-CAL - dosed in mg of elemental calcium Take 1 tablet by mouth 2 (two) times daily.   celecoxib 100 MG capsule Commonly known as:  CELEBREX Take 100 mg by mouth 2 (two) times daily.   cholecalciferol 400 units Tabs tablet Commonly known as:  VITAMIN D Take 400 Units by mouth 2 (two) times daily.   clopidogrel 75 MG tablet Commonly known as:  PLAVIX Take 1 tablet (75 mg total) by mouth daily.   DORZOLAMIDE HCL OP Place 1 drop into the left eye 2 (two) times daily.   furosemide 20 MG tablet Commonly known as:  LASIX Take 20 mg by mouth daily.   Glucosamine 750 MG Tabs Take 750 mg by mouth 2 (two) times daily.   glucosamine-chondroitin 500-400 MG tablet Take 1 tablet by mouth 2 (two) times daily. Hold while in  hospital   Lutein 6 MG Tabs Take 6 mg by mouth daily.   multivitamin tablet Take 1 tablet by mouth daily.   omega-3 acid ethyl esters 1 g capsule Commonly known as:  LOVAZA Take 1 g by mouth 2 (two) times daily.   oxyCODONE-acetaminophen 5-325 MG tablet Commonly known as:  PERCOCET/ROXICET Take 1 tablet by mouth every 6 (six) hours as needed for moderate pain.   Red Yeast Rice 600 MG Tabs Take 600 mg by mouth daily.   REFRESH PLUS OP Place 1 drop into both eyes 4 (four) times daily. My use additional drops as needed for dry eyes   sodium chloride 5 % ophthalmic solution Commonly known as:  MURO 128 Place 1 drop into both eyes 2 (two) times daily.   SYSTANE OP Place 1 drop into both eyes 2 (two) times daily.   Turmeric Curcumin 500 MG Caps Take 500 mg by mouth daily.   vitamin B-12 100 MCG tablet Commonly known as:  CYANOCOBALAMIN Take 100 mcg by mouth daily.   vitamin C 1000 MG tablet Take 1,000 mg by mouth daily.        Discharge Instructions:   [facility] Discharge Instructions Carotid Endarterectomy (CEA)  Please refer to the following instructions for your post-procedure care. Your surgeon or physician assistant will discuss any changes with you.  Activity  You are encouraged to walk as much as you can. You can slowly return to normal activities but must avoid strenuous activity and heavy lifting until your doctor tell you it's OK. Avoid activities such as vacuuming or swinging a golf club. You can drive after one week if you are comfortable and you are no longer taking prescription pain medications. It is normal to feel tired for serval weeks after your surgery. It is also normal to have difficulty with sleep habits, eating, and bowel movements after surgery. These will go away with time.  Bathing/Showering  You may shower after you come home. Do not soak in a bathtub, hot tub, or swim until the incision heals  completely.  Incision Care  Shower every day. Clean your incision with mild soap and water. Pat the area dry with a clean towel. You do not need a bandage unless otherwise instructed. Do not apply any ointments or creams to your incision. You may have skin glue on your incision. Do not peel it off. It will come off on its own in about one week. Your incision may feel thickened and raised for several weeks after your surgery. This is normal and the skin will soften  over time. For Men Only: It's OK to shave around the incision but do not shave the incision itself for 2 weeks. It is common to have numbness under your chin that could last for several months.  Diet  Resume your normal diet. There are no special food restrictions following this procedure. A low fat/low cholesterol diet is recommended for all patients with vascular disease. In order to heal from your surgery, it is CRITICAL to get adequate nutrition. Your body requires vitamins, minerals, and protein. Vegetables are the best source of vitamins and minerals. Vegetables also provide the perfect balance of protein. Processed food has little nutritional value, so try to avoid this.  Medications  Resume taking all of your medications unless your doctor or physician assistant tells you not to.  If your incision is causing pain, you may take over-the- counter pain relievers such as acetaminophen (Tylenol). If you were prescribed a stronger pain medication, please be aware these medications can cause nausea and constipation.  Prevent nausea by taking the medication with a snack or meal. Avoid constipation by drinking plenty of fluids and eating foods with a high amount of fiber, such as fruits, vegetables, and grains. Do not take Tylenol if you are taking prescription pain medications.  Follow Up  Our office will schedule a follow up appointment 2-3 weeks following discharge.  Please call us immediately for any of the following  conditions  Increased pain, redness, drainage (pus) from your incision site. Fever of 101 degrees or higher. If you should develop stroke (slurred speech, difficulty swallowing, weakness on one side of your body, loss of vision) you should call 911 and go to the nearest emergency room.  Reduce your risk of vascular disease:  Stop smoking. If you would like help call [facility] at 1-800-QUIT-NOW 670-035-0549) or [place] at ([phone]) [phone]. Manage your cholesterol Maintain a desired weight Control your diabetes Keep your blood pressure down  If you have any questions, please call the office at [phone].  Disposition: home  Patient's condition: is Good  Follow up: 1. Dr. Bridgett Larsson in 2 weeks.   Dagoberto Ligas, PA-C [facility] [phone]   Addendum  [name] is a 84 y.o. ([birth year]) female had a >90% stenosis of the right internal carotid artery that was suspicious for causing a transient ischemic attack.  She underwent right carotid endarterectomy on 11/19/17 without any complications.  The lesion was severely calcified and near occluded.  Her post-operative recovery was unremarkable.  By discharge, her neurologic status was intact.  Hemodynamics were stable without any hypertension and no evidence of neck hematoma.  The patient will be seen in the office in two weeks for incision check.   [name], MD, FACS [facility] Office: [phone] Pager: [phone]  11/20/2017, 9:18 AM    --- For VQI Registry use ---   Modified Rankin score at D/C (0-6): 0  IV medication needed for:  1. Hypertension: No 2. Hypotension: No  Post-op Complications: No  1. Post-op CVA or TIA: No  If yes: Event classification (right eye, left eye, right cortical, left cortical, verterobasilar, other):   If yes: Timing of event (intra-op, <6 hrs post-op, >=6 hrs post-op, unknown):   2. CN injury: No  If yes: CN  injuried    3. Myocardial infarction: No  If yes: Dx by (EKG or clinical, Troponin):   4.  CHF: No  5.  Dysrhythmia (new): No  6. Wound infection: No  7.  Reperfusion symptoms: No  8. Return to OR: No  If yes: return to OR for (bleeding, neurologic, other CEA incision, other):   Discharge medications: Statin use:  Yes ASA use:  Yes   Beta blocker use:  Yes ACE-Inhibitor use:  No  ARB use:  No CCB use: Yes P2Y12 Antagonist use: Yes, [x ] Plavix, [ ]  Plasugrel, [ ]  Ticlopinine, [ ]  Ticagrelor, [ ]  Other, [ ]  No for medical reason, [ ]  Non-compliant, [ ]  Not-indicated Anti-coagulant use:  No, [ ]  Warfarin, [ ]  Rivaroxaban, [ ]  Dabigatran,

## 2017-11-22 NOTE — Anesthesia Postprocedure Evaluation (Signed)
Anesthesia Post Note  Patient: Luanna Salk  Procedure(s) Performed: ENDARTERECTOMY CAROTID RIGHT (Right )     Patient location during evaluation: PACU Anesthesia Type: General Level of consciousness: awake and alert Pain management: pain level controlled Vital Signs Assessment: post-procedure vital signs reviewed and stable Respiratory status: spontaneous breathing, nonlabored ventilation, respiratory function stable and patient connected to nasal cannula oxygen Cardiovascular status: blood pressure returned to baseline and stable Postop Assessment: no apparent nausea or vomiting Anesthetic complications: no    Last Vitals:  Vitals:   11/20/17 0800 11/20/17 0900  BP:    Pulse:    Resp: 20 15  Temp:    SpO2:      Last Pain:  Vitals:   11/20/17 0502  TempSrc: Oral  PainSc:                  Letroy Vazguez

## 2017-11-23 DIAGNOSIS — J029 Acute pharyngitis, unspecified: Secondary | ICD-10-CM | POA: Diagnosis not present

## 2017-11-23 DIAGNOSIS — Z6827 Body mass index (BMI) 27.0-27.9, adult: Secondary | ICD-10-CM | POA: Diagnosis not present

## 2017-11-23 DIAGNOSIS — G458 Other transient cerebral ischemic attacks and related syndromes: Secondary | ICD-10-CM | POA: Diagnosis not present

## 2017-11-23 DIAGNOSIS — Z9889 Other specified postprocedural states: Secondary | ICD-10-CM | POA: Diagnosis not present

## 2017-11-23 DIAGNOSIS — I10 Essential (primary) hypertension: Secondary | ICD-10-CM | POA: Diagnosis not present

## 2017-11-23 DIAGNOSIS — I779 Disorder of arteries and arterioles, unspecified: Secondary | ICD-10-CM | POA: Diagnosis not present

## 2017-12-18 NOTE — Progress Notes (Addendum)
Postoperative Visit   History of Present Illness   [name] is a 84 y.o. female who presents for postoperative follow-up for: right CEA (Date: 11/19/17) for R sx carotid stenosis >90%.  The patient's neck incision is healed.  The patient has had no stroke or TIA symptoms.  Pt had a brief period of difficult biting hard foods, but this is resolved at this point.  Current Outpatient Medications  Medication Sig Dispense Refill  . alendronate (FOSAMAX) 70 MG tablet Take 70 mg by mouth every 14 (fourteen) days. Take with a full glass of water on an empty stomach.    . ALPRAZolam (XANAX) 0.25 MG tablet Take 0.125 mg by mouth at bedtime.     Marland Kitchen amLODipine (NORVASC) 2.5 MG tablet Take 2.5 mg by mouth daily.    . Ascorbic Acid (VITAMIN C) 1000 MG tablet Take 1,000 mg by mouth daily.    Marland Kitchen aspirin EC 325 MG EC tablet Take 1 tablet (325 mg total) by mouth daily. 30 tablet 0  . atenolol (TENORMIN) 50 MG tablet Take 50 mg by mouth daily.    Marland Kitchen atorvastatin (LIPITOR) 80 MG tablet Take 1 tablet (80 mg total) by mouth daily at 6 PM. 30 tablet 0  . calcium carbonate (OS-CAL - DOSED IN MG OF ELEMENTAL CALCIUM) 1250 MG tablet Take 1 tablet by mouth 2 (two) times daily.      . Carboxymethylcellulose Sodium (REFRESH PLUS OP) Place 1 drop into both eyes 4 (four) times daily. My use additional drops as needed for dry eyes    . celecoxib (CELEBREX) 100 MG capsule Take 100 mg by mouth 2 (two) times daily.    . cholecalciferol (VITAMIN D) 400 units TABS tablet Take 400 Units by mouth 2 (two) times daily.    . clopidogrel (PLAVIX) 75 MG tablet Take 1 tablet (75 mg total) by mouth daily. 30 tablet 11  . DORZOLAMIDE HCL OP Place 1 drop into the left eye 2 (two) times daily.    . furosemide (LASIX) 20 MG tablet Take 20 mg by mouth daily.    . Glucosamine 750 MG TABS Take 750 mg by mouth 2 (two) times daily.    Marland Kitchen glucosamine-chondroitin 500-400 MG tablet Take 1 tablet by mouth 2 (two) times daily. Hold while in  hospital     . Lutein 6 MG TABS Take 6 mg by mouth daily.    . Multiple Vitamin (MULTIVITAMIN) tablet Take 1 tablet by mouth daily.    Marland Kitchen omega-3 acid ethyl esters (LOVAZA) 1 G capsule Take 1 g by mouth 2 (two) times daily.     Marland Kitchen oxyCODONE-acetaminophen (PERCOCET/ROXICET) 5-325 MG tablet Take 1 tablet by mouth every 6 (six) hours as needed for moderate pain. 6 tablet 0  . Polyethyl Glycol-Propyl Glycol (SYSTANE OP) Place 1 drop into both eyes 2 (two) times daily.     . Red Yeast Rice 600 MG TABS Take 600 mg by mouth daily.    . sodium chloride (MURO 128) 5 % ophthalmic solution Place 1 drop into both eyes 2 (two) times daily.    . Turmeric Curcumin 500 MG CAPS Take 500 mg by mouth daily.    . vitamin B-12 (CYANOCOBALAMIN) 100 MCG tablet Take 100 mcg by mouth daily.      No current facility-administered medications for this visit.     For VQI Use Only   PRE-ADM LIVING: Home  AMB STATUS: Ambulatory   Physical Examination   Vitals:  12/19/17 1516  BP: 130/60  Pulse: 74  Resp: 14  Temp: 97.8 F (36.6 C)  TempSrc: Oral  SpO2: 99%  Weight: 156 lb (70.8 kg)  Height: 5\' 3"  (1.6 m)    right Neck: Incision is healed  Neuro: CN 2-12 are intact , Motor strength is 5/5 bilaterally, sensation is grossly intact   Medical Decision Making   [name] is a 84 y.o. female who presents s/p right CEA for R sx carotid stenosis >90%, asx L ICA stenosis <80%   Suspect patient's initial period of swallow issue related to manipulating a lower lying hypoglossal nerve that had to mobilized.  This is resolved at this point.  The patient's neck incision is healing with no stroke symptoms. I discussed in depth with the patient the nature of atherosclerosis, and emphasized the importance of maximal medical management including strict control of blood pressure, blood glucose, and lipid levels, obtaining regular exercise, anti-platelet use and cessation of smoking.   The patient is currently  on a statin: Lipitor.  The patient is currently on an anti-platelet: ASA and Plavix. The patient is aware that without maximal medical management the underlying atherosclerotic disease process will progress, limiting the benefit of any interventions. The patient's surveillance will included routine carotid duplex studies which will be completed in: 9 months, at which time the patient will be re-evaluated.   I emphasized the importance of routine surveillance of the carotid arteries as recurrence of stenosis is possible, especially with proper management of underlying atherosclerotic disease. The patient agrees to participate in their maximal medical care and routine surveillance.  Thank you for allowing [place] to participate in this patient's care.  [name], MD, FACS [facility] [place] Office: [phone] Pager: [phone]

## 2017-12-19 ENCOUNTER — Encounter: Payer: Self-pay | Admitting: Vascular Surgery

## 2017-12-19 ENCOUNTER — Ambulatory Visit (INDEPENDENT_AMBULATORY_CARE_PROVIDER_SITE_OTHER): Payer: Medicare Other | Admitting: Vascular Surgery

## 2017-12-19 ENCOUNTER — Other Ambulatory Visit: Payer: Self-pay

## 2017-12-19 VITALS — BP 130/60 | HR 74 | Temp 97.8°F | Resp 14 | Ht 63.0 in | Wt 156.0 lb

## 2017-12-19 DIAGNOSIS — I6523 Occlusion and stenosis of bilateral carotid arteries: Secondary | ICD-10-CM

## 2018-01-16 DIAGNOSIS — G459 Transient cerebral ischemic attack, unspecified: Secondary | ICD-10-CM | POA: Diagnosis not present

## 2018-01-16 DIAGNOSIS — Z09 Encounter for follow-up examination after completed treatment for conditions other than malignant neoplasm: Secondary | ICD-10-CM | POA: Diagnosis not present

## 2018-01-16 DIAGNOSIS — I10 Essential (primary) hypertension: Secondary | ICD-10-CM | POA: Diagnosis not present

## 2018-01-16 DIAGNOSIS — E782 Mixed hyperlipidemia: Secondary | ICD-10-CM | POA: Diagnosis not present

## 2018-02-12 ENCOUNTER — Ambulatory Visit (INDEPENDENT_AMBULATORY_CARE_PROVIDER_SITE_OTHER): Payer: Medicare Other | Admitting: Neurology

## 2018-02-12 ENCOUNTER — Encounter: Payer: Self-pay | Admitting: Neurology

## 2018-02-12 VITALS — BP 115/56 | HR 58 | Ht 63.0 in | Wt 159.8 lb

## 2018-02-12 DIAGNOSIS — I6521 Occlusion and stenosis of right carotid artery: Secondary | ICD-10-CM | POA: Diagnosis not present

## 2018-02-12 DIAGNOSIS — I6523 Occlusion and stenosis of bilateral carotid arteries: Secondary | ICD-10-CM

## 2018-02-12 NOTE — Progress Notes (Signed)
[facility] [address] [place]. [place] [address] [phone]       OFFICE FOLLOW-UP NOTE  Melinda. [name] Date of Birth:  [birth year] Medical Record Number:  [id number]   HPI: [name] is a 84 year old Caucasian lady seen today for the first office follow-up visit following hospital admission for TIA in March 2019.  History is obtained from the patient and review of electronic medical records.  I have personally reviewed imaging films.Melinda Darin Claryis an 84 y.o.femalepresenting with sudden onset of left hand weakness starting at [place] while trying to eat dinner. Denies sensory symptoms. Earlier today she had two ocular migraines, which she states she has a long history of. Her hand symptoms began well after the ocular migraines resolved. She has no history of stroke or MI. She takes ASA at home. Plavix is listed on her home medications list in Epic, but not on the sheet of current medications brought from home today.  She denied headache, vision loss, confusion, dysarthria, dysphasia, neck pain, chest pain or abdominal pain. Mild pain to left hand is endorsed.  LSN:7:30 PM on 11/10/17 TOSO:7:30 PM tPA Given:No:Mild symptoms/signs. Benefits significantly outweighed by risk of iatrogenic ICH CT scan of the head on admission showed no acute abnormality.  CT angiogram showed moderate 70% left subclavian stenosis with greater than 70% proximal right ICA stenosis as well.  MRI scan of the brain showed no acute abnormality.Carotid ultrasound showed severe 80 to 99% right ICA stenosis with significant calcific plaque with acoustic shadowing.LDL cholesterol was elevated at  103 mg percent and hemoglobin A1c was 5.1.  Patient was started on Lipitor and Plavix.  She returned a few days later for elective right carotid endarterectomy performed by Dr. Bridgett Larsson and the procedure went well.  She states the wound is healing well.  She had no recurrent stroke or TIA symptoms.  She is  tolerating Plavix well without any bleeding but does admit to easy bruising.  She is tolerating Lipitor well without muscle aches and pains.  She states her blood pressure is well controlled and today it is 1 one 5/5 6.  She has an upcoming appointment in a few weeks to see her primary physician to have follow-up lipid profile checked.  She has no new complaints today.   ROS:   14 system review of systems is positive for easy bruising and bleeding, gait imbalance only and all other systems negative  PMH:  Past Medical History:  Diagnosis Date  . Arthritis    "all the joints I've had replaced; some in my hands probably" (11/19/2017)  . Breast cancer, left breast ([place]) 2000  . Carotid stenosis, right   . High cholesterol   . History of blood transfusion    "w/both knee surgeries" (11/19/2017)  . Hypertension   . Pneumonia    "had it as a child"  . Stroke ([place])   . TIA (transient ischemic attack) 11/10/2017    Social History:  Social History   Socioeconomic History  . Marital status: Widowed    Spouse name: Not on file  . Number of children: Not on file  . Years of education: Not on file  . Highest education level: Not on file  Occupational History  . Not on file  Social Needs  . Financial resource strain: Not on file  . Food insecurity:    Worry: Not on file    Inability: Not on file  . Transportation needs:    Medical: Not on  file    Non-medical: Not on file  Tobacco Use  . Smoking status: Former Smoker    Packs/day: 1.00    Years: 25.00    Pack years: 25.00    Types: Cigarettes    Last attempt to quit: 07/20/1979    Years since quitting: 38.5  . Smokeless tobacco: Never Used  Substance and Sexual Activity  . Alcohol use: No  . Drug use: No  . Sexual activity: Not on file  Lifestyle  . Physical activity:    Days per week: Not on file    Minutes per session: Not on file  . Stress: Not on file  Relationships  . Social connections:    Talks on phone: Not on file     Gets together: Not on file    Attends religious service: Not on file    Active member of club or organization: Not on file    Attends meetings of clubs or organizations: Not on file    Relationship status: Not on file  . Intimate partner violence:    Fear of current or ex partner: Not on file    Emotionally abused: Not on file    Physically abused: Not on file    Forced sexual activity: Not on file  Other Topics Concern  . Not on file  Social History Narrative  . Not on file    Medications:   Current Outpatient Medications on File Prior to Visit  Medication Sig Dispense Refill  . alendronate (FOSAMAX) 70 MG tablet Take 70 mg by mouth every 14 (fourteen) days. Take with a full glass of water on an empty stomach.    . ALPRAZolam (XANAX) 0.25 MG tablet Take 0.125 mg by mouth at bedtime.     Marland Kitchen amLODipine (NORVASC) 2.5 MG tablet Take 2.5 mg by mouth daily.    . Ascorbic Acid (VITAMIN C) 1000 MG tablet Take 1,000 mg by mouth daily.    Marland Kitchen aspirin EC 325 MG EC tablet Take 1 tablet (325 mg total) by mouth daily. (Patient taking differently: Take 81 mg by mouth daily. ) 30 tablet 0  . atenolol (TENORMIN) 50 MG tablet Take 50 mg by mouth daily.    Marland Kitchen atorvastatin (LIPITOR) 80 MG tablet Take 1 tablet (80 mg total) by mouth daily at 6 PM. 30 tablet 0  . calcium carbonate (OS-CAL - DOSED IN MG OF ELEMENTAL CALCIUM) 1250 MG tablet Take 1 tablet by mouth 2 (two) times daily.      . Carboxymethylcellulose Sodium (REFRESH PLUS OP) Place 1 drop into both eyes 4 (four) times daily. My use additional drops as needed for dry eyes    . cholecalciferol (VITAMIN D) 400 units TABS tablet Take 400 Units by mouth 2 (two) times daily.    . clopidogrel (PLAVIX) 75 MG tablet Take 1 tablet (75 mg total) by mouth daily. 30 tablet 11  . DORZOLAMIDE HCL OP Place 1 drop into the left eye 2 (two) times daily.    . furosemide (LASIX) 20 MG tablet Take 20 mg by mouth daily.    . Glucosamine 750 MG TABS Take 750 mg by  mouth 2 (two) times daily.    Marland Kitchen glucosamine-chondroitin 500-400 MG tablet Take 1 tablet by mouth 2 (two) times daily. Hold while in hospital     . Lutein 6 MG TABS Take 6 mg by mouth daily.    . Multiple Vitamin (MULTIVITAMIN) tablet Take 1 tablet by mouth daily.    . Omega-3 1000  MG CAPS Take by mouth.    . omega-3 acid ethyl esters (LOVAZA) 1 G capsule Take 1 g by mouth 2 (two) times daily.     Vladimir Faster Glycol-Propyl Glycol (SYSTANE OP) Place 1 drop into both eyes 2 (two) times daily.     . sodium chloride (MURO 128) 5 % ophthalmic solution Place 1 drop into both eyes 2 (two) times daily.    . Turmeric Curcumin 500 MG CAPS Take 500 mg by mouth daily.    . vitamin B-12 (CYANOCOBALAMIN) 100 MCG tablet Take 100 mcg by mouth daily.      No current facility-administered medications on file prior to visit.     Allergies:   Allergies  Allergen Reactions  . Lisinopril Cough    Physical Exam General: Pleasant elderly Caucasian lady seated, in no evident distress Head: head normocephalic and atraumatic.  Neck: supple with healed right carotid endarterectomy scar.  Soft right carotid bruit heard. Cardiovascular: regular rate and rhythm, no murmurs Musculoskeletal: no deformity Skin:  no rash/petichiae Vascular:  Normal pulses all extremities Vitals:   02/12/18 1028  BP: (!) 115/56  Pulse: (!) 58   Neurologic Exam Mental Status: Awake and fully alert. Oriented to place and time. Recent and remote memory intact. Attention span, concentration and fund of knowledge appropriate. Mood and affect appropriate.  Cranial Nerves: Fundoscopic exam reveals sharp disc margins. Pupils equal, briskly reactive to light. Extraocular movements full without nystagmus. Visual fields full to confrontation. Hearing intact. Facial sensation intact. Face, tongue, palate moves normally and symmetrically.  Motor: Normal bulk and tone. Normal strength in all tested extremity muscles. Sensory.: intact to touch  ,pinprick .position and vibratory sensation.  Coordination: Rapid alternating movements normal in all extremities. Finger-to-nose and heel-to-shin performed accurately bilaterally. Gait and Station: Arises from chair without difficulty. Stance is normal. Gait demonstrates normal stride length and balance . Able to heel, toe and tandem walk with moderate difficulty.  Reflexes: 1+ and symmetric. Toes downgoing.   NIHSS  0 Modified Rankin  0   ASSESSMENT: 63 year Caucasian lady with right hemispheric TIA symptomatic from high-grade proximal right carotid stenosis in March 2019 status post elective right carotid endarterectomy who is doing well.  Vascular risk factors of hypertension, hyperlipidemia and carotid stenosis    PLAN: I had a long d/w patient about her recent TIA, right carotid stenosis and surgery risk for recurrent stroke/TIAs, personally independently reviewed imaging studies and stroke evaluation results and answered questions.Continue Plavix 75 mg dailyfor secondary stroke prevention and maintain strict control of hypertension with blood pressure goal below 130/90, diabetes with hemoglobin A1c goal below 6.5% and lipids with LDL cholesterol goal below 70 mg/dL. I also advised the patient to eat a healthy diet with plenty of whole grains, cereals, fruits and vegetables, exercise regularly and maintain ideal body weight . Check f/u lipids with Dr Virgina Jock at next visit.Followup in the future with my nurse practitioner Janett Billow in 6 months or call earlier if necessary Greater than 50% of time during this 25 minute visit was spent on counseling,explanation of diagnosis of TIA and carotid stenosis, planning of further management, discussion with patient and family and coordination of care Antony Contras, MD  [facility] [address] [place], [place] [address]  Phone [phone] Fax [phone] Note: This document was prepared with digital dictation and  possible smart phrase technology. Any transcriptional errors that result from this process are unintentional

## 2018-02-12 NOTE — Patient Instructions (Signed)
I had a long d/w patient about her recent TIA, right carotid stenosis and surgery risk for recurrent stroke/TIAs, personally independently reviewed imaging studies and stroke evaluation results and answered questions.Continue Plavix 75 mg dailyfor secondary stroke prevention and maintain strict control of hypertension with blood pressure goal below 130/90, diabetes with hemoglobin A1c goal below 6.5% and lipids with LDL cholesterol goal below 70 mg/dL. I also advised the patient to eat a healthy diet with plenty of whole grains, cereals, fruits and vegetables, exercise regularly and maintain ideal body weight . Check f/u lipids with Dr Virgina Jock at next visit.Followup in the future with my nurse practitioner Janett Billow in 6 months or call earlier if necessary  Stroke Prevention Some medical conditions and behaviors are associated with a higher chance of having a stroke. You can help prevent a stroke by making nutrition, lifestyle, and other changes, including managing any medical conditions you may have. What nutrition changes can be made?  Eat healthy foods. You can do this by: ? Choosing foods high in fiber, such as fresh fruits and vegetables and whole grains. ? Eating at least 5 or more servings of fruits and vegetables a day. Try to fill half of your plate at each meal with fruits and vegetables. ? Choosing lean protein foods, such as lean cuts of meat, poultry without skin, fish, tofu, beans, and nuts. ? Eating low-fat dairy products. ? Avoiding foods that are high in salt (sodium). This can help lower blood pressure. ? Avoiding foods that have saturated fat, trans fat, and cholesterol. This can help prevent high cholesterol. ? Avoiding processed and premade foods.  Follow your health care provider's specific guidelines for losing weight, controlling high blood pressure (hypertension), lowering high cholesterol, and managing diabetes. These may include: ? Reducing your daily calorie  intake. ? Limiting your daily sodium intake to 1,500 milligrams (mg). ? Using only healthy fats for cooking, such as olive oil, canola oil, or sunflower oil. ? Counting your daily carbohydrate intake. What lifestyle changes can be made?  Maintain a healthy weight. Talk to your health care provider about your ideal weight.  Get at least 30 minutes of moderate physical activity at least 5 days a week. Moderate activity includes brisk walking, biking, and swimming.  Do not use any products that contain nicotine or tobacco, such as cigarettes and e-cigarettes. If you need help quitting, ask your health care provider. It may also be helpful to avoid exposure to secondhand smoke.  Limit alcohol intake to no more than 1 drink a day for nonpregnant women and 2 drinks a day for men. One drink equals 12 oz of beer, 5 oz of wine, or 1 oz of hard liquor.  Stop any illegal drug use.  Avoid taking birth control pills. Talk to your health care provider about the risks of taking birth control pills if: ? You are over 39 years old. ? You smoke. ? You get migraines. ? You have ever had a blood clot. What other changes can be made?  Manage your cholesterol levels. ? Eating a healthy diet is important for preventing high cholesterol. If cholesterol cannot be managed through diet alone, you may also need to take medicines. ? Take any prescribed medicines to control your cholesterol as told by your health care provider.  Manage your diabetes. ? Eating a healthy diet and exercising regularly are important parts of managing your blood sugar. If your blood sugar cannot be managed through diet and exercise, you may need to take  medicines. ? Take any prescribed medicines to control your diabetes as told by your health care provider.  Control your hypertension. ? To reduce your risk of stroke, try to keep your blood pressure below 130/80. ? Eating a healthy diet and exercising regularly are an important part  of controlling your blood pressure. If your blood pressure cannot be managed through diet and exercise, you may need to take medicines. ? Take any prescribed medicines to control hypertension as told by your health care provider. ? Ask your health care provider if you should monitor your blood pressure at home. ? Have your blood pressure checked every year, even if your blood pressure is normal. Blood pressure increases with age and some medical conditions.  Get evaluated for sleep disorders (sleep apnea). Talk to your health care provider about getting a sleep evaluation if you snore a lot or have excessive sleepiness.  Take over-the-counter and prescription medicines only as told by your health care provider. Aspirin or blood thinners (antiplatelets or anticoagulants) may be recommended to reduce your risk of forming blood clots that can lead to stroke.  Make sure that any other medical conditions you have, such as atrial fibrillation or atherosclerosis, are managed. What are the warning signs of a stroke? The warning signs of a stroke can be easily remembered as BEFAST.  B is for balance. Signs include: ? Dizziness. ? Loss of balance or coordination. ? Sudden trouble walking.  E is for eyes. Signs include: ? A sudden change in vision. ? Trouble seeing.  F is for face. Signs include: ? Sudden weakness or numbness of the face. ? The face or eyelid drooping to one side.  A is for arms. Signs include: ? Sudden weakness or numbness of the arm, usually on one side of the body.  S is for speech. Signs include: ? Trouble speaking (aphasia). ? Trouble understanding.  T is for time. ? These symptoms may represent a serious problem that is an emergency. Do not wait to see if the symptoms will go away. Get medical help right away. Call your local emergency services (911 in the [place] Do not drive yourself to the hospital.  Other signs of stroke may include: ? A sudden, severe headache  with no known cause. ? Nausea or vomiting. ? Seizure.  Where to find more information: For more information, visit:  American Stroke Association: www.strokeassociation.org  National Stroke Association: www.stroke.org  Summary  You can prevent a stroke by eating healthy, exercising, not smoking, limiting alcohol intake, and managing any medical conditions you may have.  Do not use any products that contain nicotine or tobacco, such as cigarettes and e-cigarettes. If you need help quitting, ask your health care provider. It may also be helpful to avoid exposure to secondhand smoke.  Remember BEFAST for warning signs of stroke. Get help right away if you or a loved one has any of these signs. This information is not intended to replace advice given to you by your health care provider. Make sure you discuss any questions you have with your health care provider. Document Released: 10/05/2004 Document Revised: 10/03/2016 Document Reviewed: 10/03/2016 Elsevier Interactive Patient Education  [employer].

## 2018-02-28 DIAGNOSIS — H35372 Puckering of macula, left eye: Secondary | ICD-10-CM | POA: Diagnosis not present

## 2018-02-28 DIAGNOSIS — H35361 Drusen (degenerative) of macula, right eye: Secondary | ICD-10-CM | POA: Diagnosis not present

## 2018-02-28 DIAGNOSIS — H35362 Drusen (degenerative) of macula, left eye: Secondary | ICD-10-CM | POA: Diagnosis not present

## 2018-02-28 DIAGNOSIS — H353131 Nonexudative age-related macular degeneration, bilateral, early dry stage: Secondary | ICD-10-CM | POA: Diagnosis not present

## 2018-03-07 ENCOUNTER — Other Ambulatory Visit: Payer: Self-pay | Admitting: Internal Medicine

## 2018-03-07 DIAGNOSIS — R911 Solitary pulmonary nodule: Secondary | ICD-10-CM

## 2018-03-07 DIAGNOSIS — C50212 Malignant neoplasm of upper-inner quadrant of left female breast: Secondary | ICD-10-CM | POA: Diagnosis not present

## 2018-03-13 ENCOUNTER — Ambulatory Visit
Admission: RE | Admit: 2018-03-13 | Discharge: 2018-03-13 | Disposition: A | Discharge status: Home / Self Care | Payer: Medicare Other | Source: Ambulatory Visit | Attending: Internal Medicine | Admitting: Internal Medicine

## 2018-03-13 DIAGNOSIS — R918 Other nonspecific abnormal finding of lung field: Secondary | ICD-10-CM | POA: Diagnosis not present

## 2018-03-13 DIAGNOSIS — R911 Solitary pulmonary nodule: Secondary | ICD-10-CM

## 2018-03-21 DIAGNOSIS — M859 Disorder of bone density and structure, unspecified: Secondary | ICD-10-CM | POA: Diagnosis not present

## 2018-03-25 DIAGNOSIS — M2142 Flat foot [pes planus] (acquired), left foot: Secondary | ICD-10-CM | POA: Diagnosis not present

## 2018-03-25 DIAGNOSIS — M19072 Primary osteoarthritis, left ankle and foot: Secondary | ICD-10-CM | POA: Diagnosis not present

## 2018-03-25 DIAGNOSIS — Z96662 Presence of left artificial ankle joint: Secondary | ICD-10-CM | POA: Diagnosis not present

## 2018-04-30 DIAGNOSIS — H35373 Puckering of macula, bilateral: Secondary | ICD-10-CM | POA: Diagnosis not present

## 2018-04-30 DIAGNOSIS — H40013 Open angle with borderline findings, low risk, bilateral: Secondary | ICD-10-CM | POA: Diagnosis not present

## 2018-04-30 DIAGNOSIS — H16223 Keratoconjunctivitis sicca, not specified as Sjogren's, bilateral: Secondary | ICD-10-CM | POA: Diagnosis not present

## 2018-04-30 DIAGNOSIS — H353131 Nonexudative age-related macular degeneration, bilateral, early dry stage: Secondary | ICD-10-CM | POA: Diagnosis not present

## 2018-04-30 DIAGNOSIS — H2589 Other age-related cataract: Secondary | ICD-10-CM | POA: Diagnosis not present

## 2018-04-30 DIAGNOSIS — H1859 Other hereditary corneal dystrophies: Secondary | ICD-10-CM | POA: Diagnosis not present

## 2018-05-23 DIAGNOSIS — Z23 Encounter for immunization: Secondary | ICD-10-CM | POA: Diagnosis not present

## 2018-06-25 ENCOUNTER — Telehealth: Payer: Self-pay | Admitting: Neurology

## 2018-06-25 NOTE — Telephone Encounter (Signed)
Rn call patient that [place] office needs to contact our office or fax [place] a form. Rn stated Dr Leonie Man will review her chart if she needs to stop the plavix. Pt will have the office give [place] a call or fax a clearance form.PT verbalized understanding.

## 2018-06-25 NOTE — Telephone Encounter (Signed)
Patient having a cyst removed from her left lower leg on 07-05-18. Dr. Jeneen Rinks DeOrio needs clearance for patient to stop clopidogrel (PLAVIX) 75 MG tablet a week before her surgery. Please fax note to [phone]. ATTN: Dr. Jeneen Rinks DeOrio. Patient would like a call to advise note was faxed.

## 2018-07-03 DIAGNOSIS — M25572 Pain in left ankle and joints of left foot: Secondary | ICD-10-CM | POA: Diagnosis not present

## 2018-07-03 DIAGNOSIS — G8929 Other chronic pain: Secondary | ICD-10-CM | POA: Diagnosis not present

## 2018-07-03 DIAGNOSIS — M19072 Primary osteoarthritis, left ankle and foot: Secondary | ICD-10-CM | POA: Diagnosis not present

## 2018-07-03 DIAGNOSIS — M79672 Pain in left foot: Secondary | ICD-10-CM | POA: Diagnosis not present

## 2018-07-03 NOTE — Telephone Encounter (Signed)
PA Clarise Cruz @ [place] [phone] has called asking if pt was given the okay to come off plavix.  The entry from 10-15 was read off to [place].  She said she is scheduled to see pt this afternoon, she will see pt 1st and then fax over requested form to RN Katrina.  PA Clarise Cruz was given fax # [phone] to use when sending the form after seeing pt this afternoon.  No call back requested at this time

## 2018-07-05 DIAGNOSIS — Z8673 Personal history of transient ischemic attack (TIA), and cerebral infarction without residual deficits: Secondary | ICD-10-CM | POA: Diagnosis not present

## 2018-07-05 DIAGNOSIS — G8918 Other acute postprocedural pain: Secondary | ICD-10-CM | POA: Diagnosis not present

## 2018-07-05 DIAGNOSIS — Z853 Personal history of malignant neoplasm of breast: Secondary | ICD-10-CM | POA: Diagnosis not present

## 2018-07-05 DIAGNOSIS — G8929 Other chronic pain: Secondary | ICD-10-CM | POA: Diagnosis present

## 2018-07-05 DIAGNOSIS — Z87891 Personal history of nicotine dependence: Secondary | ICD-10-CM | POA: Diagnosis not present

## 2018-07-05 DIAGNOSIS — M85672 Other cyst of bone, left ankle and foot: Secondary | ICD-10-CM | POA: Diagnosis present

## 2018-07-05 DIAGNOSIS — I10 Essential (primary) hypertension: Secondary | ICD-10-CM | POA: Diagnosis present

## 2018-07-05 DIAGNOSIS — M19072 Primary osteoarthritis, left ankle and foot: Secondary | ICD-10-CM | POA: Diagnosis not present

## 2018-07-05 NOTE — Telephone Encounter (Signed)
Left message for Clarise Cruz PA that clearance form to fax [phone] was never receive. Rn requested it be fax again. Clarise Cruz PA was seeing pts today.

## 2018-07-23 ENCOUNTER — Other Ambulatory Visit: Payer: Self-pay | Admitting: Internal Medicine

## 2018-07-23 DIAGNOSIS — Z1231 Encounter for screening mammogram for malignant neoplasm of breast: Secondary | ICD-10-CM

## 2018-08-19 DIAGNOSIS — Z96662 Presence of left artificial ankle joint: Secondary | ICD-10-CM | POA: Diagnosis not present

## 2018-08-19 DIAGNOSIS — M2142 Flat foot [pes planus] (acquired), left foot: Secondary | ICD-10-CM | POA: Diagnosis not present

## 2018-08-21 ENCOUNTER — Encounter: Payer: Self-pay | Admitting: Adult Health

## 2018-08-21 ENCOUNTER — Ambulatory Visit (INDEPENDENT_AMBULATORY_CARE_PROVIDER_SITE_OTHER): Payer: Medicare Other | Admitting: Adult Health

## 2018-08-21 VITALS — BP 151/62 | HR 75 | Ht 63.0 in | Wt 162.0 lb

## 2018-08-21 DIAGNOSIS — I6521 Occlusion and stenosis of right carotid artery: Secondary | ICD-10-CM | POA: Diagnosis not present

## 2018-08-21 DIAGNOSIS — E785 Hyperlipidemia, unspecified: Secondary | ICD-10-CM

## 2018-08-21 DIAGNOSIS — G459 Transient cerebral ischemic attack, unspecified: Secondary | ICD-10-CM

## 2018-08-21 DIAGNOSIS — I10 Essential (primary) hypertension: Secondary | ICD-10-CM

## 2018-08-21 MED ORDER — ASPIRIN 81 MG PO TBEC: 81.0000 mg | DELAYED_RELEASE_TABLET | Freq: Every day | ORAL | 0 refills | Status: AC

## 2018-08-21 NOTE — Patient Instructions (Signed)
Continue aspirin 81 mg daily and clopidogrel 75 mg daily  and lipitor  for secondary stroke prevention  Follow up with Dr. Carlis Abbott in 10/2018 for repeat ultrasound. From a stroke standpoint, you can continued on plavix alone but please follow up with Dr. Carlis Abbott regarding need of both aspirin and plavix at your follow up appointment  Continue to follow up with PCP regarding cholesterol and blood pressure management   Continue to stay active as tolerated and maintain a healthy diet  Continue to monitor blood pressure at home  Maintain strict control of hypertension with blood pressure goal below 130/90, diabetes with hemoglobin A1c goal below 6.5% and cholesterol with LDL cholesterol (bad cholesterol) goal below 70 mg/dL. I also advised the patient to eat a healthy diet with plenty of whole grains, cereals, fruits and vegetables, exercise regularly and maintain ideal body weight.  Followup in the future with me in 6 months or call earlier if needed        Thank you for coming to see [place] at [facility]. I hope we have been able to provide you high quality care today.  You may receive a patient satisfaction survey over the next few weeks. We would appreciate your feedback and comments so that we may continue to improve ourselves and the health of our patients.

## 2018-08-21 NOTE — Progress Notes (Signed)
I agree with the above plan 

## 2018-08-21 NOTE — Progress Notes (Signed)
[facility] [address] [place]. [place] [address] [phone]       OFFICE FOLLOW-UP NOTE  [name] Date of Birth:  02/20/1931 Medical Record Number:  [id number]   HPI: [name] is a 84 year old Caucasian lady seen today for follow-up of TIA in March 2019.  History is obtained from the patient and review of electronic medical records.  I have personally reviewed imaging films. [name] an 84 y.o.femalepresenting with sudden onset of left hand weakness starting at [place] while trying to eat dinner. Denies sensory symptoms. Earlier today she had two ocular migraines, which she states she has a long history of. Her hand symptoms began well after the ocular migraines resolved. She has no history of stroke or MI. She takes ASA at home. Plavix is listed on her home medications list in Epic, but not on the sheet of current medications brought from home today.  She denied headache, vision loss, confusion, dysarthria, dysphasia, neck pain, chest pain or abdominal pain. Mild pain to left hand is endorsed.  LSN:7:30 PM on 11/10/17 TOSO:7:30 PM tPA Given:No:Mild symptoms/signs. Benefits significantly outweighed by risk of iatrogenic ICH CT scan of the head on admission showed no acute abnormality.  CT angiogram showed moderate 70% left subclavian stenosis with greater than 70% proximal right ICA stenosis as well.  MRI scan of the brain showed no acute abnormality.Carotid ultrasound showed severe 80 to 99% right ICA stenosis with significant calcific plaque with acoustic shadowing.LDL cholesterol was elevated at  103 mg percent and hemoglobin A1c was 5.1.  Patient was started on Lipitor and Plavix.  She returned a few days later for elective right carotid endarterectomy performed by Dr. Bridgett Larsson and the procedure went well.  She states the wound is healing well.  She had no recurrent stroke or TIA symptoms.  She is tolerating Plavix well without any bleeding but does  admit to easy bruising.  She is tolerating Lipitor well without muscle aches and pains.  She states her blood pressure is well controlled and today it is 1 one 5/5 6.  She has an upcoming appointment in a few weeks to see her primary physician to have follow-up lipid profile checked.  She has no new complaints today.  Interval history 08/21/18: Patient is being seen today for 69-month follow-up visit.  She states overall she continues to do well.  Patient did have cyst removed and ankle reconstructive surgery on left ankle on 07/05/2018. She has been recovering well and has recently had boot removed and has been weight baring with use of cane. She continues to live on her own as of right now but is considering on moving to [place] in the Spring. Patient continues on Plavix and aspirin 81mg  with mild bruising but no bleeding.  She states she was told by vascular surgery to continue DAPT.  She does have follow-up appointment scheduled with repeat imaging on 10/2018 with Dr. Carlis Abbott.  Continues on atorvastatin without side effects of myalgias.  Blood pressure today 151/62. She believes it has been elevated recently as she has been unable to exercise like she was previously in the water due to her ankle procedure. She does plan on returning to the gym to begin working out again and eventually returning to the pool exercises. She will be having a yearly physical the first of the year where she will have repeat lab work. She has no further concerns. Denies new or worsening stroke/TIA symptoms.  ROS:   14 system review of systems is positive for leg swelling and all other systems negative  PMH:  Past Medical History:  Diagnosis Date  . Arthritis    "all the joints I've had replaced; some in my hands probably" (11/19/2017)  . Breast cancer, left breast ([place]) 2000  . Carotid stenosis, right   . High cholesterol   . History of blood transfusion    "w/both knee surgeries" (11/19/2017)  .  Hypertension   . Pneumonia    "had it as a child"  . Stroke ([place])   . TIA (transient ischemic attack) 11/10/2017    Social History:  Social History   Socioeconomic History  . Marital status: Widowed    Spouse name: Not on file  . Number of children: Not on file  . Years of education: Not on file  . Highest education level: Not on file  Occupational History  . Not on file  Social Needs  . Financial resource strain: Not on file  . Food insecurity:    Worry: Not on file    Inability: Not on file  . Transportation needs:    Medical: Not on file    Non-medical: Not on file  Tobacco Use  . Smoking status: Former Smoker    Packs/day: 1.00    Years: 25.00    Pack years: 25.00    Types: Cigarettes    Last attempt to quit: 07/20/1979    Years since quitting: 39.1  . Smokeless tobacco: Never Used  Substance and Sexual Activity  . Alcohol use: Yes    Alcohol/week: 1.0 standard drinks    Types: 1 Glasses of wine per week    Comment: yearly  . Drug use: No  . Sexual activity: Not on file  Lifestyle  . Physical activity:    Days per week: Not on file    Minutes per session: Not on file  . Stress: Not on file  Relationships  . Social connections:    Talks on phone: Not on file    Gets together: Not on file    Attends religious service: Not on file    Active member of club or organization: Not on file    Attends meetings of clubs or organizations: Not on file    Relationship status: Not on file  . Intimate partner violence:    Fear of current or ex partner: Not on file    Emotionally abused: Not on file    Physically abused: Not on file    Forced sexual activity: Not on file  Other Topics Concern  . Not on file  Social History Narrative  . Not on file    Medications:   Current Outpatient Medications on File Prior to Visit  Medication Sig Dispense Refill  . alendronate (FOSAMAX) 70 MG tablet Take 70 mg by mouth every 14 (fourteen) days. Take with a full glass of  water on an empty stomach.    Marland Kitchen amLODipine (NORVASC) 2.5 MG tablet Take 2.5 mg by mouth daily.    . Ascorbic Acid (VITAMIN C) 1000 MG tablet Take 1,000 mg by mouth daily.    Marland Kitchen atenolol (TENORMIN) 50 MG tablet Take 50 mg by mouth daily.    Marland Kitchen atorvastatin (LIPITOR) 80 MG tablet Take 1 tablet (80 mg total) by mouth daily at 6 PM. 30 tablet 0  . calcium carbonate (OS-CAL - DOSED IN MG OF ELEMENTAL CALCIUM) 1250 MG tablet Take 1 tablet by mouth 2 (two) times daily.      Marland Kitchen  Carboxymethylcellulose Sodium (REFRESH PLUS OP) Place 1 drop into both eyes 4 (four) times daily. My use additional drops as needed for dry eyes    . cholecalciferol (VITAMIN D) 400 units TABS tablet Take 400 Units by mouth 2 (two) times daily.    . clopidogrel (PLAVIX) 75 MG tablet Take 1 tablet (75 mg total) by mouth daily. 30 tablet 11  . DORZOLAMIDE HCL OP Place 1 drop into the left eye 2 (two) times daily.    . furosemide (LASIX) 20 MG tablet Take 20 mg by mouth daily.    Marland Kitchen GLUCOSAMINE-CHONDROITIN PO Take 1,500 mg by mouth.    Marland Kitchen HYDROcodone-acetaminophen (NORCO) 10-325 MG tablet Take 1 tablet by mouth every 4 (four) hours as needed. for pain  0  . Lutein 6 MG TABS Take 6 mg by mouth daily.    . Multiple Vitamin (MULTIVITAMIN) tablet Take 1 tablet by mouth daily.    . Omega-3 1000 MG CAPS Take by mouth 2 (two) times daily.     . ondansetron (ZOFRAN-ODT) 4 MG disintegrating tablet Take 4 mg by mouth every 8 (eight) hours as needed. for nausea  0  . Polyethyl Glycol-Propyl Glycol (SYSTANE OP) Place 1 drop into both eyes 2 (two) times daily.     . sodium chloride (MURO 128) 5 % ophthalmic solution Place 1 drop into both eyes 2 (two) times daily.    . Turmeric Curcumin 500 MG CAPS Take 500 mg by mouth daily.    . vitamin B-12 (CYANOCOBALAMIN) 100 MCG tablet Take 100 mcg by mouth daily.      No current facility-administered medications on file prior to visit.     Allergies:   Allergies  Allergen Reactions  . Lisinopril Cough    . Oxycodone Other (See Comments)    Physical Exam General: very Pleasant elderly Caucasian lady seated, in no evident distress Head: head normocephalic and atraumatic.  Neck: supple with healed right carotid endarterectomy scar Cardiovascular: regular rate and rhythm, no murmurs Musculoskeletal: no deformity; LLE edema due to recent procedure Skin:  no rash/petichiae Vascular:  Normal pulses all extremities Vitals:   08/21/18 1334  BP: (!) 151/62  Pulse: 75   Neurologic Exam Mental Status: Awake and fully alert. Oriented to place and time. Recent and remote memory intact. Attention span, concentration and fund of knowledge appropriate. Mood and affect appropriate.  Cranial Nerves: Fundoscopic exam reveals sharp disc margins. Pupils equal, briskly reactive to light. Extraocular movements full without nystagmus. Visual fields full to confrontation. Hearing intact. Facial sensation intact. Face, tongue, palate moves normally and symmetrically.  Motor: Normal bulk and tone. Normal strength in all tested extremity muscles. Sensory.: intact to touch ,pinprick .position and vibratory sensation.  Coordination: Rapid alternating movements normal in all extremities. Finger-to-nose and heel-to-shin performed accurately bilaterally. Gait and Station: Arises from chair without difficulty. Stance is normal. Gait demonstrates normal stride length and balance with assistance of cane.   Reflexes: 1+ and symmetric. Toes downgoing.      ASSESSMENT: 79 year Caucasian lady with right hemispheric TIA symptomatic from high-grade proximal right carotid stenosis in March 2019 status post elective right carotid endarterectomy.  Vascular risk factors of hypertension, hyperlipidemia and carotid stenosis.  Patient returns today for follow-up visit and overall has been recovering well without residual deficit.    PLAN: -Continue Plavix and aspirin 81 mg daily and atorvastatin for secondary stroke  prevention -Patient will follow with vascular surgery Dr. Carlis Abbott 10/2018 for repeat imaging and monitoring of carotid  stenosis.  Patient was advised from a stroke standpoint she should continue on Plavix alone but to speak with vascular surgery regarding need of continuation of aspirin 81 mg daily in addition to Plavix -f/u with PCP regarding HTN and HLD management -Advised to slowly increase activity level as tolerated and maintain a healthy diet -maintain strict control of hypertension with blood pressure goal below 130/90, diabetes with hemoglobin A1c goal below 6.5% and lipids with LDL cholesterol goal below 70 mg/dL. I also advised the patient to eat a healthy diet with plenty of whole grains, cereals, fruits and vegetables, exercise regularly and maintain ideal body weight .  Follow-up in 6 months or call earlier with questions, concerns or need a sooner follow-up appointment  Greater than 50% of time during this 25 minute visit was spent on counseling,explanation of diagnosis of TIA and carotid stenosis, planning of further management, discussion with patient and family and coordination of care  Venancio Poisson, [facility]  [facility] [address] [place], [place] [address]  Phone [phone] Fax [phone] Note: This document was prepared with digital dictation and possible smart phrase technology. Any transcriptional errors that result from this process are unintentional.

## 2018-08-27 ENCOUNTER — Other Ambulatory Visit: Payer: Self-pay | Admitting: *Deleted

## 2018-08-27 MED ORDER — CLOPIDOGREL BISULFATE 75 MG PO TABS: 75.0000 mg | ORAL_TABLET | Freq: Every day | ORAL | 11 refills | Status: DC

## 2018-09-09 ENCOUNTER — Ambulatory Visit
Admission: RE | Admit: 2018-09-09 | Discharge: 2018-09-09 | Disposition: A | Discharge status: Home / Self Care | Payer: Medicare Other | Source: Ambulatory Visit | Attending: Internal Medicine | Admitting: Internal Medicine

## 2018-09-09 DIAGNOSIS — Z1231 Encounter for screening mammogram for malignant neoplasm of breast: Secondary | ICD-10-CM | POA: Diagnosis not present

## 2018-09-10 DIAGNOSIS — E7849 Other hyperlipidemia: Secondary | ICD-10-CM | POA: Diagnosis not present

## 2018-09-10 DIAGNOSIS — R82998 Other abnormal findings in urine: Secondary | ICD-10-CM | POA: Diagnosis not present

## 2018-09-10 DIAGNOSIS — I10 Essential (primary) hypertension: Secondary | ICD-10-CM | POA: Diagnosis not present

## 2018-09-10 DIAGNOSIS — M859 Disorder of bone density and structure, unspecified: Secondary | ICD-10-CM | POA: Diagnosis not present

## 2018-09-12 DIAGNOSIS — E875 Hyperkalemia: Secondary | ICD-10-CM | POA: Diagnosis not present

## 2018-09-12 DIAGNOSIS — I10 Essential (primary) hypertension: Secondary | ICD-10-CM | POA: Diagnosis not present

## 2018-09-12 DIAGNOSIS — R82998 Other abnormal findings in urine: Secondary | ICD-10-CM | POA: Diagnosis not present

## 2018-09-12 DIAGNOSIS — M810 Age-related osteoporosis without current pathological fracture: Secondary | ICD-10-CM | POA: Diagnosis not present

## 2018-09-12 DIAGNOSIS — Z0000 Encounter for general adult medical examination without abnormal findings: Secondary | ICD-10-CM | POA: Diagnosis not present

## 2018-09-17 DIAGNOSIS — R5383 Other fatigue: Secondary | ICD-10-CM | POA: Diagnosis not present

## 2018-09-17 DIAGNOSIS — Z9889 Other specified postprocedural states: Secondary | ICD-10-CM | POA: Diagnosis not present

## 2018-09-17 DIAGNOSIS — Z0000 Encounter for general adult medical examination without abnormal findings: Secondary | ICD-10-CM | POA: Diagnosis not present

## 2018-09-17 DIAGNOSIS — R627 Adult failure to thrive: Secondary | ICD-10-CM | POA: Diagnosis not present

## 2018-09-17 DIAGNOSIS — E663 Overweight: Secondary | ICD-10-CM | POA: Diagnosis not present

## 2018-09-17 DIAGNOSIS — I7300 Raynaud's syndrome without gangrene: Secondary | ICD-10-CM | POA: Diagnosis not present

## 2018-09-17 DIAGNOSIS — H3530 Unspecified macular degeneration: Secondary | ICD-10-CM | POA: Diagnosis not present

## 2018-09-17 DIAGNOSIS — D692 Other nonthrombocytopenic purpura: Secondary | ICD-10-CM | POA: Diagnosis not present

## 2018-09-17 DIAGNOSIS — I771 Stricture of artery: Secondary | ICD-10-CM | POA: Diagnosis not present

## 2018-09-17 DIAGNOSIS — Z6828 Body mass index (BMI) 28.0-28.9, adult: Secondary | ICD-10-CM | POA: Diagnosis not present

## 2018-09-17 DIAGNOSIS — J449 Chronic obstructive pulmonary disease, unspecified: Secondary | ICD-10-CM | POA: Diagnosis not present

## 2018-09-17 DIAGNOSIS — I779 Disorder of arteries and arterioles, unspecified: Secondary | ICD-10-CM | POA: Diagnosis not present

## 2018-09-19 DIAGNOSIS — Z1212 Encounter for screening for malignant neoplasm of rectum: Secondary | ICD-10-CM | POA: Diagnosis not present

## 2018-10-09 ENCOUNTER — Other Ambulatory Visit: Payer: Self-pay

## 2018-10-09 DIAGNOSIS — I6523 Occlusion and stenosis of bilateral carotid arteries: Secondary | ICD-10-CM

## 2018-10-15 ENCOUNTER — Ambulatory Visit (INDEPENDENT_AMBULATORY_CARE_PROVIDER_SITE_OTHER): Payer: Medicare Other | Admitting: Vascular Surgery

## 2018-10-15 ENCOUNTER — Other Ambulatory Visit: Payer: Self-pay

## 2018-10-15 ENCOUNTER — Ambulatory Visit (HOSPITAL_COMMUNITY)
Admission: RE | Admit: 2018-10-15 | Discharge: 2018-10-15 | Disposition: A | Discharge status: Home / Self Care | Payer: Medicare Other | Source: Ambulatory Visit | Attending: Vascular Surgery | Admitting: Vascular Surgery

## 2018-10-15 ENCOUNTER — Encounter: Payer: Self-pay | Admitting: Vascular Surgery

## 2018-10-15 VITALS — BP 143/66 | HR 60 | Temp 97.8°F | Resp 20 | Ht 63.0 in | Wt 162.0 lb

## 2018-10-15 DIAGNOSIS — I6523 Occlusion and stenosis of bilateral carotid arteries: Secondary | ICD-10-CM

## 2018-10-15 NOTE — Progress Notes (Signed)
Patient name: [name] MRN: [id number] DOB: Dec 01, 1930 Sex: female  REASON FOR VISIT: 71-month follow-up after right carotid endarterectomy  HPI: [name] is a 85 y.o. female that previously underwent right carotid endarterectomy by Dr. Bridgett Larsson on 11/19/2017.  This was for asymptomatic greater than 90% stenosis.  She has been on dual antiplatelet therapy since her TIA last year.  In addition she had a moderate stenosis on the left that was being followed.  Patient reports no new neurologic events over the last 9 months.  Overall she states she is doing well.  She is about to move into friend's home for assisted living since her husband's death.  Denies tobacco abuse.  No issues with neck incision.  Past Medical History:  Diagnosis Date  . Arthritis    "all the joints I've had replaced; some in my hands probably" (11/19/2017)  . Breast cancer, left breast ([place]) 2000  . Carotid stenosis, right   . High cholesterol   . History of blood transfusion    "w/both knee surgeries" (11/19/2017)  . Hypertension   . Pneumonia    "had it as a child"  . Stroke ([place])   . TIA (transient ischemic attack) 11/10/2017    Past Surgical History:  Procedure Laterality Date  . ANKLE SURGERY Left 03/17/2012   "replacement; done at [facility]"  . APPENDECTOMY  1948  . BREAST BIOPSY Left    "multiple"  . CAROTID ENDARTERECTOMY Right 11/19/2017  . CATARACT EXTRACTION W/ INTRAOCULAR LENS  IMPLANT, BILATERAL Bilateral 08/2005-09/2005   right-left  . DILATION AND CURETTAGE OF UTERUS    . ENDARTERECTOMY Right 11/19/2017   Procedure: ENDARTERECTOMY CAROTID RIGHT;  Surgeon: Conrad F[place], MD;  Location: [facility];  Service: Vascular;  Laterality: Right;  . EYE SURGERY Right 11/15/2009   "puckered retina"  . EYE SURGERY Left 07/11/12   "slipped lens implant due to exfollation"  . GANGLION CYST EXCISION Left 07/03/2014  . JOINT REPLACEMENT    . left shoulder replacement    . left shoulder  replacement    . MASTECTOMY, PARTIAL Left 05/1999  . repair of ankle replacment and shin remove cysts    . right shoulder replacement    . THUMB FUSION Right 1997  . TOENAIL EXCISION Right    1992  . TONSILLECTOMY  1940  . TOTAL KNEE ARTHROPLASTY Left 02/2010  . TOTAL KNEE ARTHROPLASTY Right 07/19/2009  . TOTAL SHOULDER REPLACEMENT Bilateral 01/2007-02/2008   right-left    Family History  Problem Relation Age of Onset  . Cancer Sister   . Heart disease Sister   . Breast cancer Sister        ? age of onset  . Stroke Sister     SOCIAL HISTORY: Social History   Tobacco Use  . Smoking status: Former Smoker    Packs/day: 1.00    Years: 25.00    Pack years: 25.00    Types: Cigarettes    Last attempt to quit: 07/20/1979    Years since quitting: 39.2  . Smokeless tobacco: Never Used  Substance Use Topics  . Alcohol use: Yes    Alcohol/week: 1.0 standard drinks    Types: 1 Glasses of wine per week    Comment: yearly    Allergies  Allergen Reactions  . Lisinopril Cough  . Oxycodone Other (See Comments)    Current Outpatient Medications  Medication Sig Dispense Refill  . alendronate (FOSAMAX) 70 MG tablet Take 70 mg by mouth every 14 (fourteen)  days. Take with a full glass of water on an empty stomach.    . ALPRAZolam (XANAX) 0.25 MG tablet     . amLODipine (NORVASC) 2.5 MG tablet Take 2.5 mg by mouth daily.    . Ascorbic Acid (VITAMIN C) 1000 MG tablet Take 1,000 mg by mouth daily.    Marland Kitchen aspirin 81 MG EC tablet Take 1 tablet (81 mg total) by mouth daily. 30 tablet 0  . atenolol (TENORMIN) 50 MG tablet Take 50 mg by mouth daily.    Marland Kitchen atorvastatin (LIPITOR) 80 MG tablet Take 1 tablet (80 mg total) by mouth daily at 6 PM. 30 tablet 0  . calcium carbonate (OS-CAL - DOSED IN MG OF ELEMENTAL CALCIUM) 1250 MG tablet Take 1 tablet by mouth 2 (two) times daily.      . Carboxymethylcellulose Sodium (REFRESH PLUS OP) Place 1 drop into both eyes 4 (four) times daily. My use additional  drops as needed for dry eyes    . cholecalciferol (VITAMIN D) 400 units TABS tablet Take 400 Units by mouth 2 (two) times daily.    . clopidogrel (PLAVIX) 75 MG tablet Take 1 tablet (75 mg total) by mouth daily. 30 tablet 11  . DORZOLAMIDE HCL OP Place 1 drop into the left eye 2 (two) times daily.    . furosemide (LASIX) 20 MG tablet Take 20 mg by mouth daily.    Marland Kitchen GLUCOSAMINE-CHONDROITIN PO Take 1,500 mg by mouth.    . Lutein 6 MG TABS Take 6 mg by mouth daily.    . Multiple Vitamin (MULTIVITAMIN) tablet Take 1 tablet by mouth daily.    . Omega-3 1000 MG CAPS Take by mouth 2 (two) times daily.     Vladimir Faster Glycol-Propyl Glycol (SYSTANE OP) Place 1 drop into both eyes 2 (two) times daily.     . sodium chloride (MURO 128) 5 % ophthalmic solution Place 1 drop into both eyes 2 (two) times daily.    . Turmeric Curcumin 500 MG CAPS Take 500 mg by mouth daily.    . vitamin B-12 (CYANOCOBALAMIN) 100 MCG tablet Take 100 mcg by mouth daily.     Marland Kitchen HYDROcodone-acetaminophen (NORCO) 10-325 MG tablet Take 1 tablet by mouth every 4 (four) hours as needed. for pain  0  . ondansetron (ZOFRAN-ODT) 4 MG disintegrating tablet Take 4 mg by mouth every 8 (eight) hours as needed. for nausea  0   No current facility-administered medications for this visit.     REVIEW OF SYSTEMS:  [X]  denotes positive finding, [ ]  denotes negative finding Cardiac  Comments:  Chest pain or chest pressure:    Shortness of breath upon exertion:    Short of breath when lying flat:    Irregular heart rhythm:        Vascular    Pain in calf, thigh, or hip brought on by ambulation:    Pain in feet at night that wakes you up from your sleep:     Blood clot in your veins:    Leg swelling:         Pulmonary    Oxygen at home:    Productive cough:     Wheezing:         Neurologic    Sudden weakness in arms or legs:     Sudden numbness in arms or legs:     Sudden onset of difficulty speaking or slurred speech:    Temporary  loss of vision in one eye:  Problems with dizziness:         Gastrointestinal    Blood in stool:     Vomited blood:         Genitourinary    Burning when urinating:     Blood in urine:        Psychiatric    Major depression:         Hematologic    Bleeding problems:    Problems with blood clotting too easily:        Skin    Rashes or ulcers:        Constitutional    Fever or chills:      PHYSICAL EXAM: Vitals:   10/15/18 1431  BP: (!) 143/66  Pulse: 60  Resp: 20  Temp: 97.8 F (36.6 C)  SpO2: 96%  Weight: 162 lb (73.5 kg)  Height: 5\' 3"  (1.6 m)    GENERAL: The patient is a well-nourished female, in no acute distress. The vital signs are documented above. CARDIAC: There is a regular rate and rhythm.  VASCULAR:  No appreciable carotid bruit Right neck incision well healed 2+ carotid pulses bilaterally. PULMONARY: There is good air exchange bilaterally without wheezing or rales. MUSCULOSKELETAL: There are no major deformities or cyanosis. NEUROLOGIC: No focal weakness or paresthesias are detected.  CN II-XII grossly intact. DATA:   I independently reviewed her noninvasive imaging and it shows a patent right carotid endarterectomy site with no recurrent stenosis.  On the left she previously had a moderate stenosis but that was unable to be duplicated today.  Assessment/Plan:  85 year old female now nearly one year status post right carotid endarterectomy for a symptomatic greater than 90% stenosis.  She has had no recurrent events.  Her neck incision is well-healed.  The duplex shows that her right endarterectomy site is widely patent with no recurrent stenosis.  I see no evidence of a critical stenosis on the left and given this side is asymptomatic discussed no intervention and medical management for <80% disaese.  We will have her follow-up again in one year with repeat carotid duplex.   Marty Heck, MD [facility] Office: [phone] Pager: [phone]

## 2018-11-04 DIAGNOSIS — H353131 Nonexudative age-related macular degeneration, bilateral, early dry stage: Secondary | ICD-10-CM | POA: Diagnosis not present

## 2018-11-04 DIAGNOSIS — H2589 Other age-related cataract: Secondary | ICD-10-CM | POA: Diagnosis not present

## 2018-11-04 DIAGNOSIS — H16223 Keratoconjunctivitis sicca, not specified as Sjogren's, bilateral: Secondary | ICD-10-CM | POA: Diagnosis not present

## 2018-11-04 DIAGNOSIS — H40013 Open angle with borderline findings, low risk, bilateral: Secondary | ICD-10-CM | POA: Diagnosis not present

## 2018-11-04 DIAGNOSIS — H1859 Other hereditary corneal dystrophies: Secondary | ICD-10-CM | POA: Diagnosis not present

## 2018-11-04 DIAGNOSIS — H35373 Puckering of macula, bilateral: Secondary | ICD-10-CM | POA: Diagnosis not present

## 2018-11-12 ENCOUNTER — Other Ambulatory Visit: Payer: Self-pay | Admitting: Cardiology

## 2018-11-12 DIAGNOSIS — I272 Pulmonary hypertension, unspecified: Secondary | ICD-10-CM

## 2018-11-12 DIAGNOSIS — I071 Rheumatic tricuspid insufficiency: Secondary | ICD-10-CM

## 2018-12-31 ENCOUNTER — Telehealth: Payer: Self-pay | Admitting: *Deleted

## 2018-12-31 NOTE — Telephone Encounter (Signed)
New pharmacy [facility] [address] [place] phone # [phone].

## 2019-01-16 ENCOUNTER — Other Ambulatory Visit: Payer: Self-pay

## 2019-02-06 DIAGNOSIS — H35361 Drusen (degenerative) of macula, right eye: Secondary | ICD-10-CM | POA: Diagnosis not present

## 2019-02-06 DIAGNOSIS — H353131 Nonexudative age-related macular degeneration, bilateral, early dry stage: Secondary | ICD-10-CM | POA: Diagnosis not present

## 2019-02-06 DIAGNOSIS — H35372 Puckering of macula, left eye: Secondary | ICD-10-CM | POA: Diagnosis not present

## 2019-02-06 DIAGNOSIS — H35362 Drusen (degenerative) of macula, left eye: Secondary | ICD-10-CM | POA: Diagnosis not present

## 2019-02-17 ENCOUNTER — Telehealth: Payer: Self-pay

## 2019-02-17 NOTE — Telephone Encounter (Signed)
Pt called and said that she had surgery done by Dr Bridgett Larsson in the past on her right carotid artery. She said that when they did the surgery she had a cardiac clearance done where she had to have an Echo and stress test done.   She states that cardiology has called her to set up a follow up study with her but she does not feel like she needs this. She wanted to know if there was anything that we saw in the chart that would state she needs to have this done.   Advised her that I did not see that we had ordered the test or a reason. Suggested she call Dr Keane Police office just to verify what he would suggest that she do in regards to these test.   York Cerise, [place]

## 2019-02-18 ENCOUNTER — Telehealth: Payer: Self-pay

## 2019-02-18 NOTE — Telephone Encounter (Signed)
Spoke with the patient and they have given verbal consent to file their insurance and to do a doxy.me visit. Mobile number and carrier have been confirmed and sent.   Text: [phone] Phill Mutter)

## 2019-02-20 ENCOUNTER — Ambulatory Visit (INDEPENDENT_AMBULATORY_CARE_PROVIDER_SITE_OTHER): Payer: Medicare Other | Admitting: Adult Health

## 2019-02-20 ENCOUNTER — Encounter: Payer: Self-pay | Admitting: Adult Health

## 2019-02-20 ENCOUNTER — Other Ambulatory Visit: Payer: Self-pay

## 2019-02-20 VITALS — BP 145/63

## 2019-02-20 DIAGNOSIS — E785 Hyperlipidemia, unspecified: Secondary | ICD-10-CM | POA: Diagnosis not present

## 2019-02-20 DIAGNOSIS — I6521 Occlusion and stenosis of right carotid artery: Secondary | ICD-10-CM

## 2019-02-20 DIAGNOSIS — I10 Essential (primary) hypertension: Secondary | ICD-10-CM | POA: Diagnosis not present

## 2019-02-20 DIAGNOSIS — G459 Transient cerebral ischemic attack, unspecified: Secondary | ICD-10-CM

## 2019-02-20 NOTE — Progress Notes (Signed)
[facility] [address] [place]. [place] [address] [phone]       FOLLOW-UP NOTE  [name] Date of Birth:  December 22, 1930 Medical Record Number:  [id number]   Virtual Visit via Video Note  I connected with [name] on 02/20/19 at  2:45 PM EDT by a video enabled telemedicine application located remotely in my own home and verified that I am speaking with the correct person using two identifiers who was located at their own home.   I discussed the limitations of evaluation and management by telemedicine and the availability of in person appointments. The patient expressed understanding and agreed to proceed.   HPI:  [name] an 85 y.o.femalepresented with sudden onset of left hand weakness starting at [place] while trying to eat dinner.  She was diagnosed with TIA in 11/2017.  Denies sensory symptoms. Earlier today she had two ocular migraines, which she states she has a long history of. Her hand symptoms began well after the ocular migraines resolved. She has no history of stroke or MI. She takes ASA at home. Plavix is listed on her home medications list in Epic, but not on the sheet of current medications brought from home today. She denied headache, vision loss, confusion, dysarthria, dysphasia, neck pain, chest pain or abdominal pain. Mild pain to left hand is endorsed.  LSN:7:30 PM on 11/10/17 TOSO:7:30 PM tPA Given:No:Mild symptoms/signs. Benefits significantly outweighed by risk of iatrogenic ICH CT scan of the head on admission showed no acute abnormality.  CT angiogram showed moderate 70% left subclavian stenosis with greater than 70% proximal right ICA stenosis as well.  MRI scan of the brain showed no acute abnormality.Carotid ultrasound showed severe 80 to 99% right ICA stenosis with significant calcific plaque with acoustic shadowing.LDL cholesterol was elevated at  103 mg percent and hemoglobin A1c was 5.1.  Patient was  started on Lipitor and Plavix.  She returned a few days later for elective right carotid endarterectomy performed by Dr. Bridgett Larsson and the procedure went well.  She states the wound is healing well.  She had no recurrent stroke or TIA symptoms.  She is tolerating Plavix well without any bleeding but does admit to easy bruising.  She is tolerating Lipitor well without muscle aches and pains.  She states her blood pressure is well controlled and today it is 1 one 5/5 6.  She has an upcoming appointment in a few weeks to see her primary physician to have follow-up lipid profile checked.  She has no new complaints today.   history 08/21/18: Patient is being seen today for 9-month follow-up visit.  She states overall she continues to do well.  Patient did have cyst removed and ankle reconstructive surgery on left ankle on 07/05/2018. She has been recovering well and has recently had boot removed and has been weight baring with use of cane. She continues to live on her own as of right now but is considering on moving to [place] in the Spring. Patient continues on Plavix and aspirin 81mg  with mild bruising but no bleeding.  She states she was told by vascular surgery to continue DAPT.  She does have follow-up appointment scheduled with repeat imaging on 10/2018 with Dr. Carlis Abbott.  Continues on atorvastatin without side effects of myalgias.  Blood pressure today 151/62. She believes it has been elevated recently as she has been unable to exercise like she was previously in the water due to her ankle procedure.  She does plan on returning to the gym to begin working out again and eventually returning to the pool exercises. She will be having a yearly physical the first of the year where she will have repeat lab work. She has no further concerns. Denies new or worsening stroke/TIA symptoms.   02/20/2019 VIRTUAL VISIT:  She is being seen today for follow-up regarding TIA in 11/2017.  She has been doing well from a  stroke standpoint without new or recurring stroke/TIA symptoms.  She did have repeat carotid ultrasound that showed right enterectomy site widely patent without recurrent stenosis.  It was recommended to discontinue aspirin or Plavix and continue on one agent alone.  She elected to continue on aspirin and discontinue Plavix and denies bleeding or bruising.  She continues on atorvastatin without side effects myalgias.  Blood pressure checked prior to visit with reading at 145/63, HR 63.  She is currently residing at friend's home where she moved over the spring.  She has been doing well there.  No further concerns at this time.  Denies new or worsening stroke/TIA symptoms.    ROS:   14 system review of systems is positive no complaints and all other systems negative  PMH:  Past Medical History:  Diagnosis Date   Arthritis    "all the joints I've had replaced; some in my hands probably" (11/19/2017)   Breast cancer, left breast ([place]) 2000   Carotid stenosis, right    High cholesterol    History of blood transfusion    "w/both knee surgeries" (11/19/2017)   Hypertension    Pneumonia    "had it as a child"   Stroke [facility])    TIA (transient ischemic attack) 11/10/2017    Social History:  Social History   Socioeconomic History   Marital status: Widowed    Spouse name: Not on file   Number of children: Not on file   Years of education: Not on file   Highest education level: Not on file  Occupational History   Not on file  Social Needs   Financial resource strain: Not on file   Food insecurity    Worry: Not on file    Inability: Not on file   Transportation needs    Medical: Not on file    Non-medical: Not on file  Tobacco Use   Smoking status: Former Smoker    Packs/day: 1.00    Years: 25.00    Pack years: 25.00    Types: Cigarettes    Quit date: 07/20/1979    Years since quitting: 39.6   Smokeless tobacco: Never Used  Substance and Sexual Activity    Alcohol use: Yes    Alcohol/week: 1.0 standard drinks    Types: 1 Glasses of wine per week    Comment: yearly   Drug use: No   Sexual activity: Not on file  Lifestyle   Physical activity    Days per week: Not on file    Minutes per session: Not on file   Stress: Not on file  Relationships   Social connections    Talks on phone: Not on file    Gets together: Not on file    Attends religious service: Not on file    Active member of club or organization: Not on file    Attends meetings of clubs or organizations: Not on file    Relationship status: Not on file   Intimate partner violence    Fear of current or ex partner:  Not on file    Emotionally abused: Not on file    Physically abused: Not on file    Forced sexual activity: Not on file  Other Topics Concern   Not on file  Social History Narrative   Not on file    Medications:   Current Outpatient Medications on File Prior to Visit  Medication Sig Dispense Refill   alendronate (FOSAMAX) 70 MG tablet Take 70 mg by mouth every 14 (fourteen) days. Take with a full glass of water on an empty stomach.     ALPRAZolam (XANAX) 0.25 MG tablet      amLODipine (NORVASC) 2.5 MG tablet Take 2.5 mg by mouth daily.     Ascorbic Acid (VITAMIN C) 1000 MG tablet Take 1,000 mg by mouth daily.     aspirin 81 MG EC tablet Take 1 tablet (81 mg total) by mouth daily. 30 tablet 0   atenolol (TENORMIN) 50 MG tablet Take 50 mg by mouth daily.     atorvastatin (LIPITOR) 80 MG tablet Take 1 tablet (80 mg total) by mouth daily at 6 PM. 30 tablet 0   calcium carbonate (OS-CAL - DOSED IN MG OF ELEMENTAL CALCIUM) 1250 MG tablet Take 1 tablet by mouth 2 (two) times daily.       Carboxymethylcellulose Sodium (REFRESH PLUS OP) Place 1 drop into both eyes 4 (four) times daily. My use additional drops as needed for dry eyes     cholecalciferol (VITAMIN D) 400 units TABS tablet Take 400 Units by mouth 2 (two) times daily.     DORZOLAMIDE HCL OP  Place 1 drop into the left eye 2 (two) times daily.     furosemide (LASIX) 20 MG tablet Take 20 mg by mouth daily.     GLUCOSAMINE-CHONDROITIN PO Take 1,500 mg by mouth.     HYDROcodone-acetaminophen (NORCO) 10-325 MG tablet Take 1 tablet by mouth every 4 (four) hours as needed. for pain  0   Lutein 6 MG TABS Take 6 mg by mouth daily.     Multiple Vitamin (MULTIVITAMIN) tablet Take 1 tablet by mouth daily.     Omega-3 1000 MG CAPS Take by mouth 2 (two) times daily.      ondansetron (ZOFRAN-ODT) 4 MG disintegrating tablet Take 4 mg by mouth every 8 (eight) hours as needed. for nausea  0   Polyethyl Glycol-Propyl Glycol (SYSTANE OP) Place 1 drop into both eyes 2 (two) times daily.      sodium chloride (MURO 128) 5 % ophthalmic solution Place 1 drop into both eyes 2 (two) times daily.     Turmeric Curcumin 500 MG CAPS Take 500 mg by mouth daily.     vitamin B-12 (CYANOCOBALAMIN) 100 MCG tablet Take 100 mcg by mouth daily.      No current facility-administered medications on file prior to visit.     Allergies:   Allergies  Allergen Reactions   Lisinopril Cough   Oxycodone Other (See Comments)    Vitals:   02/20/19 1507  BP: (!) 145/63   Physical exam: General: well developed, well nourished, pleasant elderly Caucasian female,, seated, in no evident distress Head: head normocephalic and atraumatic.    Neurologic Exam Mental Status: Awake and fully alert. Oriented to place and time. Recent and remote memory intact. Attention span, concentration and fund of knowledge appropriate. Mood and affect appropriate.  Cranial Nerves: Extraocular movements full without nystagmus. Hearing intact to voice. Facial sensation intact. Face, tongue, palate moves normally and symmetrically.  Shoulder  shrug symmetric. Motor: No evidence of weakness per drift assessment Sensory.: intact to light touch Coordination: Rapid alternating movements normal in all extremities. Finger-to-nose and  heel-to-shin performed accurately bilaterally. Gait and Station: Arises from chair without difficulty. Stance is normal. Gait demonstrates normal stride length and balance .  Reflexes: UTA     ASSESSMENT: 79 year Caucasian lady with right hemispheric TIA symptomatic from high-grade proximal right carotid stenosis in March 2019 status post elective right carotid endarterectomy.  Vascular risk factors of hypertension, hyperlipidemia and carotid stenosis.  She has been stable from a stroke standpoint without new or recurring stroke/TIA symptoms.    PLAN: -Continue aspirin 81 mg daily and atorvastatin for secondary stroke prevention -Follow-up with Dr. Carlis Abbott outpatient in 1 years time as recommended with repeat imaging -f/u with PCP regarding HTN and HLD management -Continue to stay active and maintain a healthy diet -Continue to monitor blood pressure at home -maintain strict control of hypertension with blood pressure goal below 130/90, diabetes with hemoglobin A1c goal below 6.5% and lipids with LDL cholesterol goal below 70 mg/dL. I also advised the patient to eat a healthy diet with plenty of whole grains, cereals, fruits and vegetables, exercise regularly and maintain ideal body weight .  Stable from stroke standpoint and recommend follow-up as needed  Greater than 50% of time during this 15 minute non-face-to-face visit was spent on counseling,explanation of diagnosis of TIA and carotid stenosis, planning of further management, discussion with patient and family and coordination of care  Venancio Poisson, [facility]  [facility] [address] [place], [place] [address]  Phone [phone] Fax [phone] Note: This document was prepared with digital dictation and possible smart phrase technology. Any transcriptional errors that result from this process are unintentional.

## 2019-02-21 NOTE — Progress Notes (Signed)
I agree with the above plan 

## 2019-03-12 DIAGNOSIS — M25572 Pain in left ankle and joints of left foot: Secondary | ICD-10-CM | POA: Diagnosis not present

## 2019-03-12 DIAGNOSIS — M79672 Pain in left foot: Secondary | ICD-10-CM | POA: Diagnosis not present

## 2019-03-12 DIAGNOSIS — G8929 Other chronic pain: Secondary | ICD-10-CM | POA: Diagnosis not present

## 2019-03-18 DIAGNOSIS — Z1331 Encounter for screening for depression: Secondary | ICD-10-CM | POA: Diagnosis not present

## 2019-05-05 DIAGNOSIS — H40013 Open angle with borderline findings, low risk, bilateral: Secondary | ICD-10-CM | POA: Diagnosis not present

## 2019-05-05 DIAGNOSIS — H35373 Puckering of macula, bilateral: Secondary | ICD-10-CM | POA: Diagnosis not present

## 2019-05-05 DIAGNOSIS — H16223 Keratoconjunctivitis sicca, not specified as Sjogren's, bilateral: Secondary | ICD-10-CM | POA: Diagnosis not present

## 2019-05-05 DIAGNOSIS — H353131 Nonexudative age-related macular degeneration, bilateral, early dry stage: Secondary | ICD-10-CM | POA: Diagnosis not present

## 2019-05-05 DIAGNOSIS — H1859 Other hereditary corneal dystrophies: Secondary | ICD-10-CM | POA: Diagnosis not present

## 2019-05-05 DIAGNOSIS — H2589 Other age-related cataract: Secondary | ICD-10-CM | POA: Diagnosis not present

## 2019-07-02 ENCOUNTER — Other Ambulatory Visit: Payer: Self-pay

## 2019-07-02 ENCOUNTER — Ambulatory Visit (INDEPENDENT_AMBULATORY_CARE_PROVIDER_SITE_OTHER): Payer: Medicare Other

## 2019-07-02 DIAGNOSIS — I071 Rheumatic tricuspid insufficiency: Secondary | ICD-10-CM

## 2019-07-02 DIAGNOSIS — I272 Pulmonary hypertension, unspecified: Secondary | ICD-10-CM

## 2019-07-06 NOTE — Progress Notes (Signed)
Please send the echo report to referring PCP.  Thanks MJP

## 2019-08-05 ENCOUNTER — Other Ambulatory Visit: Payer: Self-pay | Admitting: Internal Medicine

## 2019-08-05 DIAGNOSIS — Z1231 Encounter for screening mammogram for malignant neoplasm of breast: Secondary | ICD-10-CM

## 2019-09-15 DIAGNOSIS — Z23 Encounter for immunization: Secondary | ICD-10-CM | POA: Diagnosis not present

## 2019-09-23 DIAGNOSIS — R82998 Other abnormal findings in urine: Secondary | ICD-10-CM | POA: Diagnosis not present

## 2019-09-25 DIAGNOSIS — I771 Stricture of artery: Secondary | ICD-10-CM | POA: Diagnosis not present

## 2019-09-25 DIAGNOSIS — R911 Solitary pulmonary nodule: Secondary | ICD-10-CM | POA: Diagnosis not present

## 2019-09-25 DIAGNOSIS — H04129 Dry eye syndrome of unspecified lacrimal gland: Secondary | ICD-10-CM | POA: Diagnosis not present

## 2019-09-25 DIAGNOSIS — Z0000 Encounter for general adult medical examination without abnormal findings: Secondary | ICD-10-CM | POA: Diagnosis not present

## 2019-09-25 DIAGNOSIS — R627 Adult failure to thrive: Secondary | ICD-10-CM | POA: Diagnosis not present

## 2019-09-25 DIAGNOSIS — Z9889 Other specified postprocedural states: Secondary | ICD-10-CM | POA: Diagnosis not present

## 2019-09-25 DIAGNOSIS — E663 Overweight: Secondary | ICD-10-CM | POA: Diagnosis not present

## 2019-09-25 DIAGNOSIS — I2721 Secondary pulmonary arterial hypertension: Secondary | ICD-10-CM | POA: Diagnosis not present

## 2019-09-25 DIAGNOSIS — I351 Nonrheumatic aortic (valve) insufficiency: Secondary | ICD-10-CM | POA: Diagnosis not present

## 2019-09-25 DIAGNOSIS — I517 Cardiomegaly: Secondary | ICD-10-CM | POA: Diagnosis not present

## 2019-09-25 DIAGNOSIS — R5383 Other fatigue: Secondary | ICD-10-CM | POA: Diagnosis not present

## 2019-09-25 DIAGNOSIS — J449 Chronic obstructive pulmonary disease, unspecified: Secondary | ICD-10-CM | POA: Diagnosis not present

## 2019-09-26 ENCOUNTER — Ambulatory Visit
Admission: RE | Admit: 2019-09-26 | Discharge: 2019-09-26 | Disposition: A | Discharge status: Home / Self Care | Payer: Medicare Other | Source: Ambulatory Visit | Attending: Internal Medicine | Admitting: Internal Medicine

## 2019-09-26 ENCOUNTER — Other Ambulatory Visit: Payer: Self-pay

## 2019-09-26 DIAGNOSIS — Z1231 Encounter for screening mammogram for malignant neoplasm of breast: Secondary | ICD-10-CM

## 2019-10-13 DIAGNOSIS — Z23 Encounter for immunization: Secondary | ICD-10-CM | POA: Diagnosis not present

## 2019-10-16 DIAGNOSIS — Z1212 Encounter for screening for malignant neoplasm of rectum: Secondary | ICD-10-CM | POA: Diagnosis not present

## 2019-11-03 DIAGNOSIS — H353131 Nonexudative age-related macular degeneration, bilateral, early dry stage: Secondary | ICD-10-CM | POA: Diagnosis not present

## 2019-11-03 DIAGNOSIS — H16223 Keratoconjunctivitis sicca, not specified as Sjogren's, bilateral: Secondary | ICD-10-CM | POA: Diagnosis not present

## 2019-11-03 DIAGNOSIS — H35373 Puckering of macula, bilateral: Secondary | ICD-10-CM | POA: Diagnosis not present

## 2019-11-03 DIAGNOSIS — H18599 Other hereditary corneal dystrophies, unspecified eye: Secondary | ICD-10-CM | POA: Diagnosis not present

## 2019-11-03 DIAGNOSIS — H2589 Other age-related cataract: Secondary | ICD-10-CM | POA: Diagnosis not present

## 2019-11-03 DIAGNOSIS — H40013 Open angle with borderline findings, low risk, bilateral: Secondary | ICD-10-CM | POA: Diagnosis not present

## 2019-11-10 ENCOUNTER — Telehealth (HOSPITAL_COMMUNITY): Payer: Self-pay

## 2019-11-10 NOTE — Telephone Encounter (Signed)

## 2019-11-11 ENCOUNTER — Ambulatory Visit (HOSPITAL_COMMUNITY)
Admission: RE | Admit: 2019-11-11 | Discharge: 2019-11-11 | Disposition: A | Discharge status: Home / Self Care | Payer: Medicare Other | Source: Ambulatory Visit | Attending: Vascular Surgery | Admitting: Vascular Surgery

## 2019-11-11 ENCOUNTER — Other Ambulatory Visit: Payer: Self-pay

## 2019-11-11 ENCOUNTER — Ambulatory Visit (INDEPENDENT_AMBULATORY_CARE_PROVIDER_SITE_OTHER): Payer: Medicare Other | Admitting: Vascular Surgery

## 2019-11-11 ENCOUNTER — Encounter: Payer: Self-pay | Admitting: Vascular Surgery

## 2019-11-11 VITALS — BP 145/61 | HR 85 | Temp 97.2°F | Resp 14 | Ht 63.0 in | Wt 165.0 lb

## 2019-11-11 DIAGNOSIS — I6523 Occlusion and stenosis of bilateral carotid arteries: Secondary | ICD-10-CM

## 2019-11-11 NOTE — Progress Notes (Signed)
Patient name: [name] MRN: [id number] DOB: 1930-12-04 Sex: female  REASON FOR VISIT: 1 year follow-up for ongoing surveillance in setting of remote right carotid endarterectomy  HPI: [name] is a 86 y.o. female that previously underwent right carotid endarterectomy by Dr. Bridgett Larsson on 11/19/2017.  This was for symptomatic greater than 90% stenosis with TIA.  She reports no new neurologic events over the last year.  She started water aerobics again today.  No other specific concerns.  No vision loss.  No unilateral arm or leg symptoms.  Doing well otherwise.  Past Medical History:  Diagnosis Date  . Arthritis    "all the joints I've had replaced; some in my hands probably" (11/19/2017)  . Breast cancer, left breast ([place]) 2000  . Carotid stenosis, right   . High cholesterol   . History of blood transfusion    "w/both knee surgeries" (11/19/2017)  . Hypertension   . Pneumonia    "had it as a child"  . Stroke ([place])   . TIA (transient ischemic attack) 11/10/2017    Past Surgical History:  Procedure Laterality Date  . ANKLE SURGERY Left 03/17/2012   "replacement; done at [facility]"  . APPENDECTOMY  1948  . BREAST BIOPSY Left    "multiple"  . CAROTID ENDARTERECTOMY Right 11/19/2017  . CATARACT EXTRACTION W/ INTRAOCULAR LENS  IMPLANT, BILATERAL Bilateral 08/2005-09/2005   right-left  . DILATION AND CURETTAGE OF UTERUS    . ENDARTERECTOMY Right 11/19/2017   Procedure: ENDARTERECTOMY CAROTID RIGHT;  Surgeon: Conrad Butts, MD;  Location: [place];  Service: Vascular;  Laterality: Right;  . EYE SURGERY Right 11/15/2009   "puckered retina"  . EYE SURGERY Left 07/11/12   "slipped lens implant due to exfollation"  . GANGLION CYST EXCISION Left 07/03/2014  . JOINT REPLACEMENT    . left shoulder replacement    . left shoulder replacement    . MASTECTOMY, PARTIAL Left 05/1999  . repair of ankle replacment and shin remove cysts    . right shoulder replacement    . THUMB  FUSION Right 1997  . TOENAIL EXCISION Right    1992  . TONSILLECTOMY  1940  . TOTAL KNEE ARTHROPLASTY Left 02/2010  . TOTAL KNEE ARTHROPLASTY Right 07/19/2009  . TOTAL SHOULDER REPLACEMENT Bilateral 01/2007-02/2008   right-left    Family History  Problem Relation Age of Onset  . Cancer Sister   . Heart disease Sister   . Breast cancer Sister        ? age of onset  . Stroke Sister     SOCIAL HISTORY: Social History   Tobacco Use  . Smoking status: Former Smoker    Packs/day: 1.00    Years: 25.00    Pack years: 25.00    Types: Cigarettes    Quit date: 07/20/1979    Years since quitting: 40.3  . Smokeless tobacco: Never Used  Substance Use Topics  . Alcohol use: Yes    Alcohol/week: 1.0 standard drinks    Types: 1 Glasses of wine per week    Comment: yearly    Allergies  Allergen Reactions  . Lisinopril Cough  . Oxycodone Other (See Comments)    Current Outpatient Medications  Medication Sig Dispense Refill  . alendronate (FOSAMAX) 70 MG tablet Take 70 mg by mouth every 14 (fourteen) days. Take with a full glass of water on an empty stomach.    . ALPRAZolam (XANAX) 0.25 MG tablet     . amLODipine (NORVASC) 2.5  MG tablet Take 2.5 mg by mouth daily.    . Ascorbic Acid (VITAMIN C) 1000 MG tablet Take 1,000 mg by mouth daily.    Marland Kitchen aspirin 81 MG EC tablet Take 1 tablet (81 mg total) by mouth daily. 30 tablet 0  . atenolol (TENORMIN) 50 MG tablet Take 50 mg by mouth daily.    Marland Kitchen atorvastatin (LIPITOR) 80 MG tablet Take 1 tablet (80 mg total) by mouth daily at 6 PM. 30 tablet 0  . calcium carbonate (OS-CAL - DOSED IN MG OF ELEMENTAL CALCIUM) 1250 MG tablet Take 1 tablet by mouth 2 (two) times daily.      . Carboxymethylcellulose Sodium (REFRESH PLUS OP) Place 1 drop into both eyes 4 (four) times daily. My use additional drops as needed for dry eyes    . cholecalciferol (VITAMIN D) 400 units TABS tablet Take 400 Units by mouth 2 (two) times daily.    . DORZOLAMIDE HCL OP  Place 1 drop into the left eye 2 (two) times daily.    . furosemide (LASIX) 20 MG tablet Take 20 mg by mouth daily.    Marland Kitchen GLUCOSAMINE-CHONDROITIN PO Take 1,500 mg by mouth.    Marland Kitchen HYDROcodone-acetaminophen (NORCO) 10-325 MG tablet Take 1 tablet by mouth every 4 (four) hours as needed. for pain  0  . Lutein 6 MG TABS Take 6 mg by mouth daily.    . Multiple Vitamin (MULTIVITAMIN) tablet Take 1 tablet by mouth daily.    . Omega-3 1000 MG CAPS Take by mouth 2 (two) times daily.     . ondansetron (ZOFRAN-ODT) 4 MG disintegrating tablet Take 4 mg by mouth every 8 (eight) hours as needed. for nausea  0  . Polyethyl Glycol-Propyl Glycol (SYSTANE OP) Place 1 drop into both eyes 2 (two) times daily.     . sodium chloride (MURO 128) 5 % ophthalmic solution Place 1 drop into both eyes 2 (two) times daily.    . Turmeric Curcumin 500 MG CAPS Take 500 mg by mouth daily.    . vitamin B-12 (CYANOCOBALAMIN) 100 MCG tablet Take 100 mcg by mouth daily.      No current facility-administered medications for this visit.    REVIEW OF SYSTEMS:  [X]  denotes positive finding, [ ]  denotes negative finding Cardiac  Comments:  Chest pain or chest pressure:    Shortness of breath upon exertion:    Short of breath when lying flat:    Irregular heart rhythm:        Vascular    Pain in calf, thigh, or hip brought on by ambulation:    Pain in feet at night that wakes you up from your sleep:     Blood clot in your veins:    Leg swelling:         Pulmonary    Oxygen at home:    Productive cough:     Wheezing:         Neurologic    Sudden weakness in arms or legs:     Sudden numbness in arms or legs:     Sudden onset of difficulty speaking or slurred speech:    Temporary loss of vision in one eye:     Problems with dizziness:         Gastrointestinal    Blood in stool:     Vomited blood:         Genitourinary    Burning when urinating:     Blood in urine:  Psychiatric    Major depression:           Hematologic    Bleeding problems:    Problems with blood clotting too easily:        Skin    Rashes or ulcers:        Constitutional    Fever or chills:      PHYSICAL EXAM: Vitals:   11/11/19 1413  BP: (!) 145/61  Pulse: 85  Resp: 14  Temp: (!) 97.2 F (36.2 C)  TempSrc: Temporal  SpO2: 100%  Weight: 165 lb (74.8 kg)  Height: 5\' 3"  (1.6 m)    GENERAL: The patient is a well-nourished female, in no acute distress. The vital signs are documented above. CARDIAC: There is a regular rate and rhythm.  VASCULAR:  Right neck incision well healed PULMONARY: There is good air exchange bilaterally without wheezing or rales. MUSCULOSKELETAL: There are no major deformities or cyanosis. NEUROLOGIC: No focal weakness or paresthesias are detected.  CN II-XII grossly intact. DATA:   Independent review her carotid duplex and she has patent right carotid endarterectomy site and 1-39% stenosis on the left.  Assessment/Plan:  86 year old female status post right carotid endarterectomy 11/19/2017 by Dr. Bridgett Larsson for symptomatic greater than 90% stenosis.  Right carotid site looks patent on duplex today she has had no recurrent neurologic symptoms.  She has no significant contralateral disease either.  Discussed that we typically arrange follow-up in 1 year but since she has done so well she would like to extend her follow-up and we will plan to see her again in 2 years.  Marty Heck, MD [facility] Office: [phone]

## 2019-11-12 ENCOUNTER — Other Ambulatory Visit: Payer: Self-pay | Admitting: *Deleted

## 2019-11-12 DIAGNOSIS — I6523 Occlusion and stenosis of bilateral carotid arteries: Secondary | ICD-10-CM

## 2020-02-03 ENCOUNTER — Encounter (INDEPENDENT_AMBULATORY_CARE_PROVIDER_SITE_OTHER): Payer: Medicare Other | Admitting: Ophthalmology

## 2020-02-05 ENCOUNTER — Encounter (INDEPENDENT_AMBULATORY_CARE_PROVIDER_SITE_OTHER): Payer: Medicare Other | Admitting: Ophthalmology

## 2020-02-24 ENCOUNTER — Encounter (INDEPENDENT_AMBULATORY_CARE_PROVIDER_SITE_OTHER): Payer: Medicare Other | Admitting: Ophthalmology

## 2020-02-26 ENCOUNTER — Ambulatory Visit (INDEPENDENT_AMBULATORY_CARE_PROVIDER_SITE_OTHER): Payer: Medicare Other | Admitting: Ophthalmology

## 2020-02-26 ENCOUNTER — Encounter (INDEPENDENT_AMBULATORY_CARE_PROVIDER_SITE_OTHER): Payer: Self-pay | Admitting: Ophthalmology

## 2020-02-26 ENCOUNTER — Other Ambulatory Visit: Payer: Self-pay

## 2020-02-26 DIAGNOSIS — H353132 Nonexudative age-related macular degeneration, bilateral, intermediate dry stage: Secondary | ICD-10-CM

## 2020-02-26 DIAGNOSIS — H18519 Endothelial corneal dystrophy, unspecified eye: Secondary | ICD-10-CM | POA: Diagnosis not present

## 2020-02-26 NOTE — Progress Notes (Signed)
02/26/2020     CHIEF COMPLAINT Patient presents for Retina Follow Up   HISTORY OF PRESENT ILLNESS: [name] is a 86 y.o. female who presents to the clinic today for:   HPI    Retina Follow Up    Patient presents with  Dry AMD.  In both eyes.  Duration of 1 year.  Since onset it is stable.          Comments    1 year follow up- FP OU Patient denies change in vision and overall has no complaints.        Last edited by Gerda Diss on 02/26/2020  1:40 PM. (History)      Referring physician: Shon Baton, MD [place],  [place] [id number]  HISTORICAL INFORMATION:   Selected notes from the MEDICAL RECORD NUMBER    Lab Results  Component Value Date   HGBA1C 5.1 11/11/2017     CURRENT MEDICATIONS: Current Outpatient Medications (Ophthalmic Drugs)  Medication Sig  . Carboxymethylcellulose Sodium (REFRESH PLUS OP) Place 1 drop into both eyes 4 (four) times daily. My use additional drops as needed for dry eyes  . DORZOLAMIDE HCL OP Place 1 drop into the left eye 2 (two) times daily.  Vladimir Faster Glycol-Propyl Glycol (SYSTANE OP) Place 1 drop into both eyes 2 (two) times daily.   . sodium chloride (MURO 128) 5 % ophthalmic solution Place 1 drop into both eyes 2 (two) times daily.   No current facility-administered medications for this visit. (Ophthalmic Drugs)   Current Outpatient Medications (Other)  Medication Sig  . alendronate (FOSAMAX) 70 MG tablet Take 70 mg by mouth every 14 (fourteen) days. Take with a full glass of water on an empty stomach.  . ALPRAZolam (XANAX) 0.25 MG tablet   . amLODipine (NORVASC) 2.5 MG tablet Take 2.5 mg by mouth daily.  . Ascorbic Acid (VITAMIN C) 1000 MG tablet Take 1,000 mg by mouth daily.  Marland Kitchen aspirin 81 MG EC tablet Take 1 tablet (81 mg total) by mouth daily.  Marland Kitchen atenolol (TENORMIN) 50 MG tablet Take 50 mg by mouth daily.  Marland Kitchen atorvastatin (LIPITOR) 80 MG tablet Take 1 tablet (80 mg total) by mouth daily at 6 PM.   . calcium carbonate (OS-CAL - DOSED IN MG OF ELEMENTAL CALCIUM) 1250 MG tablet Take 1 tablet by mouth 2 (two) times daily.    . cholecalciferol (VITAMIN D) 400 units TABS tablet Take 400 Units by mouth 2 (two) times daily.  . furosemide (LASIX) 20 MG tablet Take 20 mg by mouth daily.  Marland Kitchen GLUCOSAMINE-CHONDROITIN PO Take 1,500 mg by mouth.  Marland Kitchen HYDROcodone-acetaminophen (NORCO) 10-325 MG tablet Take 1 tablet by mouth every 4 (four) hours as needed. for pain  . Lutein 6 MG TABS Take 6 mg by mouth daily.  . Multiple Vitamin (MULTIVITAMIN) tablet Take 1 tablet by mouth daily.  . Omega-3 1000 MG CAPS Take by mouth 2 (two) times daily.   . ondansetron (ZOFRAN-ODT) 4 MG disintegrating tablet Take 4 mg by mouth every 8 (eight) hours as needed. for nausea  . Turmeric Curcumin 500 MG CAPS Take 500 mg by mouth daily.  . vitamin B-12 (CYANOCOBALAMIN) 100 MCG tablet Take 100 mcg by mouth daily.    No current facility-administered medications for this visit. (Other)      REVIEW OF SYSTEMS:    ALLERGIES Allergies  Allergen Reactions  . Lisinopril Cough  . Oxycodone Other (See Comments)    PAST MEDICAL HISTORY  Past Medical History:  Diagnosis Date  . Arthritis    "all the joints I've had replaced; some in my hands probably" (11/19/2017)  . Breast cancer, left breast ([place]) 2000  . Carotid stenosis, right   . High cholesterol   . History of blood transfusion    "w/both knee surgeries" (11/19/2017)  . Hypertension   . Pneumonia    "had it as a child"  . Stroke ([place])   . TIA (transient ischemic attack) 11/10/2017   Past Surgical History:  Procedure Laterality Date  . ANKLE SURGERY Left 03/17/2012   "replacement; done at [facility]"  . APPENDECTOMY  1948  . BREAST BIOPSY Left    "multiple"  . CAROTID ENDARTERECTOMY Right 11/19/2017  . CATARACT EXTRACTION W/ INTRAOCULAR LENS  IMPLANT, BILATERAL Bilateral 08/2005-09/2005   right-left  . DILATION AND CURETTAGE OF UTERUS    . ENDARTERECTOMY Right  11/19/2017   Procedure: ENDARTERECTOMY CAROTID RIGHT;  Surgeon: Conrad Leonard, MD;  Location: [facility];  Service: Vascular;  Laterality: Right;  . EYE SURGERY Right 11/15/2009   "puckered retina"  . EYE SURGERY Left 07/11/12   "slipped lens implant due to exfollation"  . GANGLION CYST EXCISION Left 07/03/2014  . JOINT REPLACEMENT    . left shoulder replacement    . left shoulder replacement    . MASTECTOMY, PARTIAL Left 05/1999  . repair of ankle replacment and shin remove cysts    . right shoulder replacement    . THUMB FUSION Right 1997  . TOENAIL EXCISION Right    1992  . TONSILLECTOMY  1940  . TOTAL KNEE ARTHROPLASTY Left 02/2010  . TOTAL KNEE ARTHROPLASTY Right 07/19/2009  . TOTAL SHOULDER REPLACEMENT Bilateral 01/2007-02/2008   right-left    FAMILY HISTORY Family History  Problem Relation Age of Onset  . Cancer Sister   . Heart disease Sister   . Breast cancer Sister        ? age of onset  . Stroke Sister     SOCIAL HISTORY Social History   Tobacco Use  . Smoking status: Former Smoker    Packs/day: 1.00    Years: 25.00    Pack years: 25.00    Types: Cigarettes    Quit date: 07/20/1979    Years since quitting: 40.6  . Smokeless tobacco: Never Used  Vaping Use  . Vaping Use: Never used  Substance Use Topics  . Alcohol use: Yes    Alcohol/week: 1.0 standard drink    Types: 1 Glasses of wine per week    Comment: yearly  . Drug use: No         OPHTHALMIC EXAM: Base Eye Exam    Visual Acuity (Snellen - Linear)      Right Left   Dist [place] 20/30+2 20/20   Dist ph [place] 20/25-1    Correction: Glasses       Tonometry (Tonopen, 1:44 PM)      Right Left   Pressure 10 11       Pupils      Pupils Dark Light Shape React APD   Right PERRL 4 3 Round Slow None   Left PERRL 4 3 Irregular Slow None       Visual Fields (Counting fingers)      Left Right    Full Full       Extraocular Movement      Right Left    Full Full       Neuro/Psych    Oriented x3:  Yes   Mood/Affect: Normal       Dilation    Both eyes: 1.0% Mydriacyl, 2.5% Phenylephrine @ 1:44 PM        Slit Lamp and Fundus Exam    External Exam      Right Left   External Normal Normal       Slit Lamp Exam      Right Left   Lids/Lashes Normal Normal   Conjunctiva/Sclera White and quiet White and quiet   Cornea Clear Clear   Anterior Chamber Deep and quiet Deep and quiet   Iris Round and reactive Round and reactive   Lens Posterior chamber intraocular lens Anterior chamber intraocular lens   Vitreous Normal Normal          IMAGING AND PROCEDURES  Imaging and Procedures for 02/26/20  OCT, Retina - OU - Both Eyes       Right Eye Quality was good. Scan locations included subfoveal.   Left Eye Quality was good. Scan locations included subfoveal.                 ASSESSMENT/PLAN:  No problem-specific Assessment & Plan notes found for this encounter.    No diagnosis found.  1.  2.  3.  Ophthalmic Meds Ordered this visit:  No orders of the defined types were placed in this encounter.      No follow-ups on file.  There are no Patient Instructions on file for this visit.   Explained the diagnoses, plan, and follow up with the patient and they expressed understanding.  Patient expressed understanding of the importance of proper follow up care.   Clent Demark Jordy Verba M.D. Diseases & Surgery of the Retina and Vitreous Retina & Diabetic [place] 02/26/20     Abbreviations: M myopia (nearsighted); A astigmatism; H hyperopia (farsighted); P presbyopia; Mrx spectacle prescription;  CTL contact lenses; OD right eye; OS left eye; OU both eyes  XT exotropia; ET esotropia; PEK punctate epithelial keratitis; PEE punctate epithelial erosions; DES dry eye syndrome; MGD meibomian gland dysfunction; ATs artificial tears; PFAT's preservative free artificial tears; [place] nuclear sclerotic cataract; PSC posterior subcapsular cataract; ERM epi-retinal membrane;  PVD posterior vitreous detachment; RD retinal detachment; DM diabetes mellitus; DR diabetic retinopathy; NPDR non-proliferative diabetic retinopathy; PDR proliferative diabetic retinopathy; CSME clinically significant macular edema; DME diabetic macular edema; dbh dot blot hemorrhages; CWS cotton wool spot; POAG primary open angle glaucoma; C/D cup-to-disc ratio; HVF humphrey visual field; GVF goldmann visual field; OCT optical coherence tomography; IOP intraocular pressure; BRVO Branch retinal vein occlusion; CRVO central retinal vein occlusion; CRAO central retinal artery occlusion; BRAO branch retinal artery occlusion; RT retinal tear; SB scleral buckle; PPV pars plana vitrectomy; VH Vitreous hemorrhage; PRP panretinal laser photocoagulation; IVK intravitreal kenalog; VMT vitreomacular traction; MH Macular hole;  NVD neovascularization of the disc; NVE neovascularization elsewhere; AREDS age related eye disease study; ARMD age related macular degeneration; POAG primary open angle glaucoma; EBMD epithelial/anterior basement membrane dystrophy; ACIOL anterior chamber intraocular lens; IOL intraocular lens; PCIOL posterior chamber intraocular lens; Phaco/IOL phacoemulsification with intraocular lens placement; [place] photorefractive keratectomy; LASIK laser assisted in situ keratomileusis; HTN hypertension; DM diabetes mellitus; COPD chronic obstructive pulmonary disease

## 2020-05-04 DIAGNOSIS — Z961 Presence of intraocular lens: Secondary | ICD-10-CM | POA: Diagnosis not present

## 2020-05-04 DIAGNOSIS — H353131 Nonexudative age-related macular degeneration, bilateral, early dry stage: Secondary | ICD-10-CM | POA: Diagnosis not present

## 2020-05-04 DIAGNOSIS — H16223 Keratoconjunctivitis sicca, not specified as Sjogren's, bilateral: Secondary | ICD-10-CM | POA: Diagnosis not present

## 2020-05-04 DIAGNOSIS — H2589 Other age-related cataract: Secondary | ICD-10-CM | POA: Diagnosis not present

## 2020-05-04 DIAGNOSIS — H40013 Open angle with borderline findings, low risk, bilateral: Secondary | ICD-10-CM | POA: Diagnosis not present

## 2020-05-04 DIAGNOSIS — H35373 Puckering of macula, bilateral: Secondary | ICD-10-CM | POA: Diagnosis not present

## 2020-05-04 DIAGNOSIS — H18593 Other hereditary corneal dystrophies, bilateral: Secondary | ICD-10-CM | POA: Diagnosis not present

## 2020-06-23 DIAGNOSIS — Z23 Encounter for immunization: Secondary | ICD-10-CM | POA: Diagnosis not present

## 2020-07-20 DIAGNOSIS — Z23 Encounter for immunization: Secondary | ICD-10-CM | POA: Diagnosis not present

## 2020-08-02 DIAGNOSIS — M25511 Pain in right shoulder: Secondary | ICD-10-CM | POA: Diagnosis not present

## 2020-08-03 DIAGNOSIS — M25811 Other specified joint disorders, right shoulder: Secondary | ICD-10-CM | POA: Diagnosis not present

## 2020-08-03 DIAGNOSIS — M25511 Pain in right shoulder: Secondary | ICD-10-CM | POA: Diagnosis not present

## 2020-08-17 ENCOUNTER — Other Ambulatory Visit: Payer: Self-pay | Admitting: Internal Medicine

## 2020-08-17 DIAGNOSIS — Z1231 Encounter for screening mammogram for malignant neoplasm of breast: Secondary | ICD-10-CM

## 2020-09-20 DIAGNOSIS — E785 Hyperlipidemia, unspecified: Secondary | ICD-10-CM | POA: Diagnosis not present

## 2020-09-20 DIAGNOSIS — E559 Vitamin D deficiency, unspecified: Secondary | ICD-10-CM | POA: Diagnosis not present

## 2020-09-27 DIAGNOSIS — I351 Nonrheumatic aortic (valve) insufficiency: Secondary | ICD-10-CM | POA: Diagnosis not present

## 2020-09-27 DIAGNOSIS — I771 Stricture of artery: Secondary | ICD-10-CM | POA: Diagnosis not present

## 2020-09-27 DIAGNOSIS — R0600 Dyspnea, unspecified: Secondary | ICD-10-CM | POA: Diagnosis not present

## 2020-09-27 DIAGNOSIS — D0512 Intraductal carcinoma in situ of left breast: Secondary | ICD-10-CM | POA: Diagnosis not present

## 2020-09-27 DIAGNOSIS — D692 Other nonthrombocytopenic purpura: Secondary | ICD-10-CM | POA: Diagnosis not present

## 2020-09-27 DIAGNOSIS — R600 Localized edema: Secondary | ICD-10-CM | POA: Diagnosis not present

## 2020-09-27 DIAGNOSIS — Z0000 Encounter for general adult medical examination without abnormal findings: Secondary | ICD-10-CM | POA: Diagnosis not present

## 2020-09-27 DIAGNOSIS — J449 Chronic obstructive pulmonary disease, unspecified: Secondary | ICD-10-CM | POA: Diagnosis not present

## 2020-09-27 DIAGNOSIS — I2721 Secondary pulmonary arterial hypertension: Secondary | ICD-10-CM | POA: Diagnosis not present

## 2020-09-27 DIAGNOSIS — E785 Hyperlipidemia, unspecified: Secondary | ICD-10-CM | POA: Diagnosis not present

## 2020-09-27 DIAGNOSIS — R5383 Other fatigue: Secondary | ICD-10-CM | POA: Diagnosis not present

## 2020-09-27 DIAGNOSIS — I10 Essential (primary) hypertension: Secondary | ICD-10-CM | POA: Diagnosis not present

## 2020-09-29 ENCOUNTER — Ambulatory Visit
Admission: RE | Admit: 2020-09-29 | Discharge: 2020-09-29 | Disposition: A | Discharge status: Home / Self Care | Payer: Medicare Other | Source: Ambulatory Visit | Attending: Internal Medicine | Admitting: Internal Medicine

## 2020-09-29 ENCOUNTER — Other Ambulatory Visit: Payer: Self-pay

## 2020-09-29 DIAGNOSIS — Z1231 Encounter for screening mammogram for malignant neoplasm of breast: Secondary | ICD-10-CM

## 2020-11-01 DIAGNOSIS — H16223 Keratoconjunctivitis sicca, not specified as Sjogren's, bilateral: Secondary | ICD-10-CM | POA: Diagnosis not present

## 2020-11-01 DIAGNOSIS — Z961 Presence of intraocular lens: Secondary | ICD-10-CM | POA: Diagnosis not present

## 2020-11-01 DIAGNOSIS — H18593 Other hereditary corneal dystrophies, bilateral: Secondary | ICD-10-CM | POA: Diagnosis not present

## 2020-11-01 DIAGNOSIS — H40013 Open angle with borderline findings, low risk, bilateral: Secondary | ICD-10-CM | POA: Diagnosis not present

## 2020-11-01 DIAGNOSIS — H353131 Nonexudative age-related macular degeneration, bilateral, early dry stage: Secondary | ICD-10-CM | POA: Diagnosis not present

## 2020-11-01 DIAGNOSIS — H2589 Other age-related cataract: Secondary | ICD-10-CM | POA: Diagnosis not present

## 2020-11-01 DIAGNOSIS — H35373 Puckering of macula, bilateral: Secondary | ICD-10-CM | POA: Diagnosis not present

## 2021-03-01 ENCOUNTER — Encounter (INDEPENDENT_AMBULATORY_CARE_PROVIDER_SITE_OTHER): Payer: Medicare Other | Admitting: Ophthalmology

## 2021-03-21 ENCOUNTER — Encounter (INDEPENDENT_AMBULATORY_CARE_PROVIDER_SITE_OTHER): Payer: Medicare Other | Admitting: Ophthalmology

## 2021-03-25 DIAGNOSIS — I10 Essential (primary) hypertension: Secondary | ICD-10-CM | POA: Diagnosis not present

## 2021-04-18 ENCOUNTER — Other Ambulatory Visit: Payer: Self-pay

## 2021-04-18 ENCOUNTER — Encounter (INDEPENDENT_AMBULATORY_CARE_PROVIDER_SITE_OTHER): Payer: Self-pay | Admitting: Ophthalmology

## 2021-04-18 ENCOUNTER — Ambulatory Visit (INDEPENDENT_AMBULATORY_CARE_PROVIDER_SITE_OTHER): Payer: Medicare Other | Admitting: Ophthalmology

## 2021-04-18 DIAGNOSIS — H353132 Nonexudative age-related macular degeneration, bilateral, intermediate dry stage: Secondary | ICD-10-CM

## 2021-04-18 DIAGNOSIS — H353131 Nonexudative age-related macular degeneration, bilateral, early dry stage: Secondary | ICD-10-CM | POA: Diagnosis not present

## 2021-04-18 NOTE — Progress Notes (Signed)
04/18/2021     CHIEF COMPLAINT Patient presents for Macular Degeneration (No interval change in vision in each eye over the last 1 year, no distortions)   HISTORY OF PRESENT ILLNESS: [name] is a 87 y.o. female who presents to the clinic today for:   HPI     Macular Degeneration           Comments: No interval change in vision in each eye over the last 1 year, no distortions       Last edited by Hurman Horn, MD on 04/18/2021  3:20 PM.      Referring physician: Shon Baton, MD [place],  [place] [id number]  HISTORICAL INFORMATION:   Selected notes from the [facility]    Lab Results  Component Value Date   HGBA1C 5.1 11/11/2017     CURRENT MEDICATIONS: Current Outpatient Medications (Ophthalmic Drugs)  Medication Sig   Carboxymethylcellulose Sodium (REFRESH PLUS OP) Place 1 drop into both eyes 4 (four) times daily. My use additional drops as needed for dry eyes   DORZOLAMIDE HCL OP Place 1 drop into the left eye 2 (two) times daily.   Polyethyl Glycol-Propyl Glycol (SYSTANE OP) Place 1 drop into both eyes 2 (two) times daily.    sodium chloride (MURO 128) 5 % ophthalmic solution Place 1 drop into both eyes 2 (two) times daily.   No current facility-administered medications for this visit. (Ophthalmic Drugs)   Current Outpatient Medications (Other)  Medication Sig   alendronate (FOSAMAX) 70 MG tablet Take 70 mg by mouth every 14 (fourteen) days. Take with a full glass of water on an empty stomach.   ALPRAZolam (XANAX) 0.25 MG tablet    amLODipine (NORVASC) 2.5 MG tablet Take 2.5 mg by mouth daily.   Ascorbic Acid (VITAMIN C) 1000 MG tablet Take 1,000 mg by mouth daily.   aspirin 81 MG EC tablet Take 1 tablet (81 mg total) by mouth daily.   atenolol (TENORMIN) 50 MG tablet Take 50 mg by mouth daily.   atorvastatin (LIPITOR) 80 MG tablet Take 1 tablet (80 mg total) by mouth daily at 6 PM.   calcium carbonate (OS-CAL - DOSED IN  MG OF ELEMENTAL CALCIUM) 1250 MG tablet Take 1 tablet by mouth 2 (two) times daily.     cholecalciferol (VITAMIN D) 400 units TABS tablet Take 400 Units by mouth 2 (two) times daily.   furosemide (LASIX) 20 MG tablet Take 20 mg by mouth daily.   GLUCOSAMINE-CHONDROITIN PO Take 1,500 mg by mouth.   HYDROcodone-acetaminophen (NORCO) 10-325 MG tablet Take 1 tablet by mouth every 4 (four) hours as needed. for pain   Lutein 6 MG TABS Take 6 mg by mouth daily.   Multiple Vitamin (MULTIVITAMIN) tablet Take 1 tablet by mouth daily.   Omega-3 1000 MG CAPS Take by mouth 2 (two) times daily.    ondansetron (ZOFRAN-ODT) 4 MG disintegrating tablet Take 4 mg by mouth every 8 (eight) hours as needed. for nausea   Turmeric Curcumin 500 MG CAPS Take 500 mg by mouth daily.   vitamin B-12 (CYANOCOBALAMIN) 100 MCG tablet Take 100 mcg by mouth daily.    No current facility-administered medications for this visit. (Other)      REVIEW OF SYSTEMS: ROS   Negative for: Constitutional, Gastrointestinal, Neurological, Skin, Genitourinary, Musculoskeletal, HENT, Endocrine, Cardiovascular, Eyes, Respiratory, Psychiatric, Allergic/Imm, Heme/Lymph Last edited by Hurman Horn, MD on 04/18/2021  3:20 PM.  ALLERGIES Allergies  Allergen Reactions   Lisinopril Cough   Oxycodone Other (See Comments)    PAST MEDICAL HISTORY Past Medical History:  Diagnosis Date   Arthritis    "all the joints I've had replaced; some in my hands probably" (11/19/2017)   Breast cancer, left breast ([place]) 2000   Carotid stenosis, right    High cholesterol    History of blood transfusion    "w/both knee surgeries" (11/19/2017)   Hypertension    Pneumonia    "had it as a child"   Stroke [facility])    TIA (transient ischemic attack) 11/10/2017   Past Surgical History:  Procedure Laterality Date   ANKLE SURGERY Left 03/17/2012   "replacement; done at [facility]"   [place] Left    "multiple"   CAROTID  ENDARTERECTOMY Right 11/19/2017   CATARACT EXTRACTION W/ INTRAOCULAR LENS  IMPLANT, BILATERAL Bilateral 08/2005-09/2005   right-left   DILATION AND CURETTAGE OF UTERUS     ENDARTERECTOMY Right 11/19/2017   Procedure: ENDARTERECTOMY CAROTID RIGHT;  Surgeon: Conrad Palestine, MD;  Location: [facility];  Service: Vascular;  Laterality: Right;   EYE SURGERY Right 11/15/2009   "puckered retina"   EYE SURGERY Left 07/11/12   "slipped lens implant due to exfollation"   GANGLION CYST EXCISION Left 07/03/2014   JOINT REPLACEMENT     left shoulder replacement     left shoulder replacement     MASTECTOMY, PARTIAL Left 05/1999   repair of ankle replacment and shin remove cysts     right shoulder replacement     THUMB FUSION Right 1997   TOENAIL EXCISION Right    1992   TONSILLECTOMY  1940   TOTAL KNEE ARTHROPLASTY Left 02/2010   TOTAL KNEE ARTHROPLASTY Right 07/19/2009   TOTAL SHOULDER REPLACEMENT Bilateral 01/2007-02/2008   right-left    FAMILY HISTORY Family History  Problem Relation Age of Onset   Cancer Sister    Heart disease Sister    Breast cancer Sister        ? age of onset   Stroke Sister     SOCIAL HISTORY Social History   Tobacco Use   Smoking status: Former    Packs/day: 1.00    Years: 25.00    Pack years: 25.00    Types: Cigarettes    Quit date: 07/20/1979    Years since quitting: 41.7   Smokeless tobacco: Never  Vaping Use   Vaping Use: Never used  Substance Use Topics   Alcohol use: Yes    Alcohol/week: 1.0 standard drink    Types: 1 Glasses of wine per week    Comment: yearly   Drug use: No         OPHTHALMIC EXAM:  Base Eye Exam     Visual Acuity (ETDRS)       Right Left   Dist [place] 20/25 -1 20/30 +1    Correction: Glasses         Tonometry (Tonopen, 3:17 PM)       Right Left   Pressure 6 10         Pupils       Pupils React APD   Right PERRL Brisk None   Left PERRL Brisk None         Extraocular Movement       Right Left    Full,  Ortho Full, Ortho         Neuro/Psych     Oriented x3:  Yes   Mood/Affect: Normal         Dilation     Both eyes: 1.0% Mydriacyl, 2.5% Phenylephrine @ 3:17 PM           Slit Lamp and Fundus Exam     External Exam       Right Left   External Normal Normal         Slit Lamp Exam       Right Left   Lids/Lashes Normal Normal   Conjunctiva/Sclera White and quiet White and quiet   Cornea Clear Clear   Anterior Chamber Deep and quiet Deep and quiet   Iris Round and reactive horizontal oval and reactive   Lens Posterior chamber intraocular lens Anterior chamber intraocular lens   Anterior Vitreous Normal Normal         Fundus Exam       Right Left   Posterior Vitreous Posterior vitreous detachment Posterior vitreous detachment   Disc 1+ Pallor 1+ Pallor   C/D Ratio 0.45 0.35   Macula Retinal pigment epithelial mottling Retinal pigment epithelial mottling   Vessels Normal Normal   Periphery Normal Normal            IMAGING AND PROCEDURES  Imaging and Procedures for 04/18/21  OCT, Retina - OU - Both Eyes       Right Eye Quality was good. Scan locations included subfoveal. Central Foveal Thickness: 344. Progression has been stable. Findings include retinal drusen , no SRF.   Left Eye Quality was good. Scan locations included subfoveal. Central Foveal Thickness: 273. Findings include no SRF, retinal drusen .      Color Fundus Photography Optos - OU - Both Eyes       Right Eye Progression has been stable. Disc findings include increased cup to disc ratio, pallor. Macula : drusen. Vessels : normal observations. Periphery : normal observations.   Left Eye Progression has been stable. Disc findings include normal observations. Vessels : normal observations. Periphery : normal observations.              ASSESSMENT/PLAN:  Early stage nonexudative age-related macular degeneration of both eyes Mild ARMD OU, no high risk features continue to  observeThe nature of age--related macular degeneration was discussed with the patient as well as the distinction between dry and wet types. Checking an Amsler Grid daily with advice to return immediately should a distortion develop, was given to the patient. The patient 's smoking status now and in the past was determined and advice based on the AREDS study was provided regarding the consumption of antioxidant supplements. AREDS 2 vitamin formulation was recommended. Consumption of dark leafy vegetables and fresh fruits of various colors was recommended. Treatment modalities for wet macular degeneration particularly the use of intravitreal injections of anti-blood vessel growth factors was discussed with the patient. Avastin, Lucentis, and Eylea are the available options. On occasion, therapy includes the use of photodynamic therapy and thermal laser. Stressed to the patient do not rub eyes.  Patient was advised to check Amsler Grid daily and return immediately if changes are noted. Instructions on using the grid were given to the patient. All patient questions were answered.     ICD-10-CM   1. 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 Early stage nonexudative age-related macular degeneration of both eyes  H35.3131  1.  Incidentally I explained to the patient the importance of do not mash compress or rub the eye.  For burning eyes I suggest use of artificial teardrops which there are variety of brand names and off brand names for which she must find the ones that feel the best for the surface of her eye  Of also encouraged her to use Patanol or Pataday drops also over-the-counter for itchiness.  But do not compress mash or rub the eyes as they tend to become more friable and delicate over time  2.  3.  Ophthalmic Meds Ordered this visit:  No orders of the defined  types were placed in this encounter.      Return in about 1 year (around 04/18/2022) for DILATE OU, COLOR FP, OCT.  There are no Patient Instructions on file for this visit.   Explained the diagnoses, plan, and follow up with the patient and they expressed understanding.  Patient expressed understanding of the importance of proper follow up care.   Clent Demark Tashonna Descoteaux M.D. Diseases & Surgery of the Retina and Vitreous Retina & Diabetic [place] 04/18/21     Abbreviations: M myopia (nearsighted); A astigmatism; H hyperopia (farsighted); P presbyopia; Mrx spectacle prescription;  CTL contact lenses; OD right eye; OS left eye; OU both eyes  XT exotropia; ET esotropia; PEK punctate epithelial keratitis; PEE punctate epithelial erosions; DES dry eye syndrome; MGD meibomian gland dysfunction; ATs artificial tears; PFAT's preservative free artificial tears; [place] nuclear sclerotic cataract; PSC posterior subcapsular cataract; ERM epi-retinal membrane; PVD posterior vitreous detachment; RD retinal detachment; DM diabetes mellitus; DR diabetic retinopathy; NPDR non-proliferative diabetic retinopathy; PDR proliferative diabetic retinopathy; CSME clinically significant macular edema; DME diabetic macular edema; dbh dot blot hemorrhages; CWS cotton wool spot; POAG primary open angle glaucoma; C/D cup-to-disc ratio; HVF humphrey visual field; GVF goldmann visual field; OCT optical coherence tomography; IOP intraocular pressure; BRVO Branch retinal vein occlusion; CRVO central retinal vein occlusion; CRAO central retinal artery occlusion; BRAO branch retinal artery occlusion; RT retinal tear; SB scleral buckle; PPV pars plana vitrectomy; VH Vitreous hemorrhage; PRP panretinal laser photocoagulation; IVK intravitreal kenalog; VMT vitreomacular traction; MH Macular hole;  NVD neovascularization of the disc; NVE neovascularization elsewhere; AREDS age related eye disease study; ARMD age related macular degeneration;  POAG primary open angle glaucoma; EBMD epithelial/anterior basement membrane dystrophy; ACIOL anterior chamber intraocular lens; IOL intraocular lens; PCIOL posterior chamber intraocular lens; Phaco/IOL phacoemulsification with intraocular lens placement; [place] photorefractive keratectomy; LASIK laser assisted in situ keratomileusis; HTN hypertension; DM diabetes mellitus; COPD chronic obstructive pulmonary disease

## 2021-04-18 NOTE — Assessment & Plan Note (Signed)
Mild ARMD OU, no high risk features continue to observeThe nature of age--related macular degeneration was discussed with the patient as well as the distinction between dry and wet types. Checking an Amsler Grid daily with advice to return immediately should a distortion develop, was given to the patient. The patient 's smoking status now and in the past was determined and advice based on the AREDS study was provided regarding the consumption of antioxidant supplements. AREDS 2 vitamin formulation was recommended. Consumption of dark leafy vegetables and fresh fruits of various colors was recommended. Treatment modalities for wet macular degeneration particularly the use of intravitreal injections of anti-blood vessel growth factors was discussed with the patient. Avastin, Lucentis, and Eylea are the available options. On occasion, therapy includes the use of photodynamic therapy and thermal laser. Stressed to the patient do not rub eyes.  Patient was advised to check Amsler Grid daily and return immediately if changes are noted. Instructions on using the grid were given to the patient. All patient questions were answered.

## 2021-05-03 DIAGNOSIS — H35373 Puckering of macula, bilateral: Secondary | ICD-10-CM | POA: Diagnosis not present

## 2021-05-03 DIAGNOSIS — H16223 Keratoconjunctivitis sicca, not specified as Sjogren's, bilateral: Secondary | ICD-10-CM | POA: Diagnosis not present

## 2021-05-03 DIAGNOSIS — H18593 Other hereditary corneal dystrophies, bilateral: Secondary | ICD-10-CM | POA: Diagnosis not present

## 2021-05-03 DIAGNOSIS — Z961 Presence of intraocular lens: Secondary | ICD-10-CM | POA: Diagnosis not present

## 2021-05-03 DIAGNOSIS — H40013 Open angle with borderline findings, low risk, bilateral: Secondary | ICD-10-CM | POA: Diagnosis not present

## 2021-05-03 DIAGNOSIS — H2589 Other age-related cataract: Secondary | ICD-10-CM | POA: Diagnosis not present

## 2021-05-03 DIAGNOSIS — H353131 Nonexudative age-related macular degeneration, bilateral, early dry stage: Secondary | ICD-10-CM | POA: Diagnosis not present

## 2021-05-31 DIAGNOSIS — Z23 Encounter for immunization: Secondary | ICD-10-CM | POA: Diagnosis not present

## 2021-06-27 DIAGNOSIS — Z23 Encounter for immunization: Secondary | ICD-10-CM | POA: Diagnosis not present

## 2021-09-22 DIAGNOSIS — E785 Hyperlipidemia, unspecified: Secondary | ICD-10-CM | POA: Diagnosis not present

## 2021-09-22 DIAGNOSIS — E559 Vitamin D deficiency, unspecified: Secondary | ICD-10-CM | POA: Diagnosis not present

## 2021-09-22 DIAGNOSIS — I10 Essential (primary) hypertension: Secondary | ICD-10-CM | POA: Diagnosis not present

## 2021-10-05 DIAGNOSIS — R82998 Other abnormal findings in urine: Secondary | ICD-10-CM | POA: Diagnosis not present

## 2021-11-01 DIAGNOSIS — H18593 Other hereditary corneal dystrophies, bilateral: Secondary | ICD-10-CM | POA: Diagnosis not present

## 2021-11-01 DIAGNOSIS — H16223 Keratoconjunctivitis sicca, not specified as Sjogren's, bilateral: Secondary | ICD-10-CM | POA: Diagnosis not present

## 2021-11-01 DIAGNOSIS — Z961 Presence of intraocular lens: Secondary | ICD-10-CM | POA: Diagnosis not present

## 2021-11-01 DIAGNOSIS — H2589 Other age-related cataract: Secondary | ICD-10-CM | POA: Diagnosis not present

## 2021-11-01 DIAGNOSIS — H353131 Nonexudative age-related macular degeneration, bilateral, early dry stage: Secondary | ICD-10-CM | POA: Diagnosis not present

## 2021-11-01 DIAGNOSIS — H35373 Puckering of macula, bilateral: Secondary | ICD-10-CM | POA: Diagnosis not present

## 2021-11-01 DIAGNOSIS — H40013 Open angle with borderline findings, low risk, bilateral: Secondary | ICD-10-CM | POA: Diagnosis not present

## 2021-11-04 ENCOUNTER — Other Ambulatory Visit: Payer: Self-pay | Admitting: Internal Medicine

## 2021-11-04 DIAGNOSIS — Z1231 Encounter for screening mammogram for malignant neoplasm of breast: Secondary | ICD-10-CM

## 2021-11-08 ENCOUNTER — Ambulatory Visit: Payer: Medicare Other

## 2021-11-08 ENCOUNTER — Encounter (HOSPITAL_COMMUNITY): Payer: Medicare Other

## 2021-11-16 ENCOUNTER — Ambulatory Visit
Admission: RE | Admit: 2021-11-16 | Discharge: 2021-11-16 | Disposition: A | Discharge status: Home / Self Care | Payer: Medicare Other | Source: Ambulatory Visit | Attending: Internal Medicine | Admitting: Internal Medicine

## 2021-11-16 DIAGNOSIS — Z1231 Encounter for screening mammogram for malignant neoplasm of breast: Secondary | ICD-10-CM | POA: Diagnosis not present

## 2021-12-26 NOTE — Progress Notes (Signed)
?HISTORY AND PHYSICAL  ? ? ? ?CC:  follow up. ?Requesting Provider:  Shon Baton, MD ? ?HPI: This is a 88 y.o. female here for follow up for carotid artery stenosis.  Pt is s/p right CEA for symptomatic carotid artery stenosis on 11/19/2017 by Dr. Bridgett Larsson.   ? ?Pt was last seen 11/11/2019 and at that time she was doing well without any new neurologic events.   ? ?Pt returns today for follow up.   ? ?Pt denies any amaurosis fugax, speech difficulties, weakness, numbness, paralysis or clumsiness or facial droop.  She states she has occasional dizziness that she contributes to her sinuses or constipation.  She does not really get arm fatigue on the right as she can't really lift her right arm due to shoulder issues.  ? ?The pt is on a statin for cholesterol management.  ?The pt is on a daily aspirin.   Other AC:  none ?The pt is on CCB, diuretic, BB for hypertension.   ?The pt is not diabetic.   ?Tobacco hx:  former ? ?Pt does not have family hx of AAA. ? ?Past Medical History:  ?Diagnosis Date  ? Arthritis   ? "all the joints I've had replaced; some in my hands probably" (11/19/2017)  ? Breast cancer, left breast ([place]) 2000  ? Carotid stenosis, right   ? High cholesterol   ? History of blood transfusion   ? "w/both knee surgeries" (11/19/2017)  ? Hypertension   ? Pneumonia   ? "had it as a child"  ? Stroke [facility])   ? TIA (transient ischemic attack) 11/10/2017  ? ? ?Past Surgical History:  ?Procedure Laterality Date  ? ANKLE SURGERY Left 03/17/2012  ? "replacement; done at [facility]"  ? APPENDECTOMY  1948  ? BREAST BIOPSY Left   ? "multiple"  ? CAROTID ENDARTERECTOMY Right 11/19/2017  ? CATARACT EXTRACTION W/ INTRAOCULAR LENS  IMPLANT, BILATERAL Bilateral 08/2005-09/2005  ? right-left  ? DILATION AND CURETTAGE OF UTERUS    ? ENDARTERECTOMY Right 11/19/2017  ? Procedure: ENDARTERECTOMY CAROTID RIGHT;  Surgeon: Conrad [place], MD;  Location: [facility];  Service: Vascular;  Laterality: Right;  ? EYE SURGERY Right 11/15/2009  ? "puckered  retina"  ? EYE SURGERY Left 07/11/12  ? "slipped lens implant due to exfollation"  ? GANGLION CYST EXCISION Left 07/03/2014  ? JOINT REPLACEMENT    ? left shoulder replacement    ? left shoulder replacement    ? MASTECTOMY, PARTIAL Left 05/1999  ? repair of ankle replacment and shin remove cysts    ? right shoulder replacement    ? THUMB FUSION Right 1997  ? TOENAIL EXCISION Right   ? 1992  ? TONSILLECTOMY  1940  ? TOTAL KNEE ARTHROPLASTY Left 02/2010  ? TOTAL KNEE ARTHROPLASTY Right 07/19/2009  ? TOTAL SHOULDER REPLACEMENT Bilateral 01/2007-02/2008  ? right-left  ? ? ?Allergies  ?Allergen Reactions  ? Lisinopril Cough  ? Oxycodone Other (See Comments)  ? ? ?Current Outpatient Medications  ?Medication Sig Dispense Refill  ? alendronate (FOSAMAX) 70 MG tablet Take 70 mg by mouth every 14 (fourteen) days. Take with a full glass of water on an empty stomach.    ? ALPRAZolam (XANAX) 0.25 MG tablet     ? amLODipine (NORVASC) 2.5 MG tablet Take 2.5 mg by mouth daily.    ? Ascorbic Acid (VITAMIN C) 1000 MG tablet Take 1,000 mg by mouth daily.    ? aspirin 81 MG EC tablet Take 1 tablet (81 mg  total) by mouth daily. 30 tablet 0  ? atenolol (TENORMIN) 50 MG tablet Take 50 mg by mouth daily.    ? atorvastatin (LIPITOR) 80 MG tablet Take 1 tablet (80 mg total) by mouth daily at 6 PM. 30 tablet 0  ? calcium carbonate (OS-CAL - DOSED IN MG OF ELEMENTAL CALCIUM) 1250 MG tablet Take 1 tablet by mouth 2 (two) times daily.      ? Carboxymethylcellulose Sodium (REFRESH PLUS OP) Place 1 drop into both eyes 4 (four) times daily. My use additional drops as needed for dry eyes    ? cholecalciferol (VITAMIN D) 400 units TABS tablet Take 400 Units by mouth 2 (two) times daily.    ? DORZOLAMIDE HCL OP Place 1 drop into the left eye 2 (two) times daily.    ? furosemide (LASIX) 20 MG tablet Take 20 mg by mouth daily.    ? GLUCOSAMINE-CHONDROITIN PO Take 1,500 mg by mouth.    ? HYDROcodone-acetaminophen (NORCO) 10-325 MG tablet Take 1 tablet by  mouth every 4 (four) hours as needed. for pain  0  ? Lutein 6 MG TABS Take 6 mg by mouth daily.    ? Multiple Vitamin (MULTIVITAMIN) tablet Take 1 tablet by mouth daily.    ? Omega-3 1000 MG CAPS Take by mouth 2 (two) times daily.     ? ondansetron (ZOFRAN-ODT) 4 MG disintegrating tablet Take 4 mg by mouth every 8 (eight) hours as needed. for nausea  0  ? Polyethyl Glycol-Propyl Glycol (SYSTANE OP) Place 1 drop into both eyes 2 (two) times daily.     ? sodium chloride (MURO 128) 5 % ophthalmic solution Place 1 drop into both eyes 2 (two) times daily.    ? Turmeric Curcumin 500 MG CAPS Take 500 mg by mouth daily.    ? vitamin B-12 (CYANOCOBALAMIN) 100 MCG tablet Take 100 mcg by mouth daily.     ? ?No current facility-administered medications for this visit.  ? ? ?Family History  ?Problem Relation Age of Onset  ? Cancer Sister   ? Heart disease Sister   ? Breast cancer Sister   ?     ? age of onset  ? Stroke Sister   ? ? ?Social History  ? ?Socioeconomic History  ? Marital status: Widowed  ?  Spouse name: Not on file  ? Number of children: Not on file  ? Years of education: Not on file  ? Highest education level: Not on file  ?Occupational History  ? Not on file  ?Tobacco Use  ? Smoking status: Former  ?  Packs/day: 1.00  ?  Years: 25.00  ?  Pack years: 25.00  ?  Types: Cigarettes  ?  Quit date: 07/20/1979  ?  Years since quitting: 42.4  ? Smokeless tobacco: Never  ?Vaping Use  ? Vaping Use: Never used  ?Substance and Sexual Activity  ? Alcohol use: Yes  ?  Alcohol/week: 1.0 standard drink  ?  Types: 1 Glasses of wine per week  ?  Comment: yearly  ? Drug use: No  ? Sexual activity: Not on file  ?Other Topics Concern  ? Not on file  ?Social History Narrative  ? Not on file  ? ?Social Determinants of Health  ? ?Financial Resource Strain: Not on file  ?Food Insecurity: Not on file  ?Transportation Needs: Not on file  ?Physical Activity: Not on file  ?Stress: Not on file  ?Social Connections: Not on file  ?Intimate Partner  Violence:  Not on file  ? ? ? ?REVIEW OF SYSTEMS:  ? ?'[X]'$  denotes positive finding, '[ ]'$  denotes negative finding ?Cardiac  Comments:  ?Chest pain or chest pressure:    ?Shortness of breath upon exertion:    ?Short of breath when lying flat:    ?Irregular heart rhythm:    ?    ?Vascular    ?Pain in calf, thigh, or hip brought on by ambulation:    ?Pain in feet at night that wakes you up from your sleep:     ?Blood clot in your veins:    ?Leg swelling:  x   ?    ?Pulmonary    ?Oxygen at home:    ?Productive cough:     ?Wheezing:     ?    ?Neurologic    ?Sudden weakness in arms or legs:     ?Sudden numbness in arms or legs:     ?Sudden onset of difficulty speaking or slurred speech:    ?Temporary loss of vision in one eye:     ?Problems with dizziness:     ?    ?Gastrointestinal    ?Blood in stool:     ?Vomited blood:     ?    ?Genitourinary    ?Burning when urinating:     ?Blood in urine:    ?    ?Psychiatric    ?Major depression:     ?    ?Hematologic    ?Bleeding problems:    ?Problems with blood clotting too easily:    ?    ?Skin    ?Rashes or ulcers:    ?    ?Constitutional    ?Fever or chills:    ? ? ?PHYSICAL EXAMINATION: ? ?Today's Vitals  ? 12/27/21 1308  ?BP: (!) 166/73  ?Pulse: 69  ?Resp: 18  ?Temp: (!) 97.3 ?F (36.3 ?C)  ?TempSrc: Temporal  ?SpO2: 97%  ?Weight: 173 lb 4.8 oz (78.6 kg)  ?Height: '5\' 3"'$  (1.6 m)  ?PainSc: 0-No pain  ? ?Body mass index is 30.7 kg/m?. ? ? ?General:  WDWN in NAD; vital signs documented above ?Gait: Not observed ?HENT: WNL, normocephalic ?Pulmonary: normal non-labored breathing ?Cardiac: regular HR, without carotid bruits ?Abdomen: soft, NT; aortic pulse is not palpable ?Skin: without rashes ?Vascular Exam/Pulses: ?Easily palpable radial pulses bilaterally ?Extremities: without ischemic changes, without Gangrene , without cellulitis; without open wounds ?Musculoskeletal: no muscle wasting or atrophy  ?Neurologic: A&O X 3; moving all extremities equally; speech is  fluent/normal ?Psychiatric:  The pt has Normal affect. ? ? ?Non-Invasive Vascular Imaging:   ?Carotid Duplex on 12/27/2021 ?Right:  patent endarterectomy site without stenosis ?Left:  1-39% ICA stenosis ?Bilateral subclavian

## 2021-12-27 ENCOUNTER — Encounter: Payer: Self-pay | Admitting: Physician Assistant

## 2021-12-27 ENCOUNTER — Ambulatory Visit (HOSPITAL_COMMUNITY)
Admission: RE | Admit: 2021-12-27 | Discharge: 2021-12-27 | Disposition: A | Discharge status: Home / Self Care | Payer: Medicare Other | Source: Ambulatory Visit | Attending: Vascular Surgery | Admitting: Vascular Surgery

## 2021-12-27 ENCOUNTER — Ambulatory Visit (INDEPENDENT_AMBULATORY_CARE_PROVIDER_SITE_OTHER): Payer: Medicare Other | Admitting: Physician Assistant

## 2021-12-27 VITALS — BP 166/73 | HR 69 | Temp 97.3°F | Resp 18 | Ht 63.0 in | Wt 173.3 lb

## 2021-12-27 DIAGNOSIS — I6523 Occlusion and stenosis of bilateral carotid arteries: Secondary | ICD-10-CM | POA: Insufficient documentation

## 2021-12-30 DIAGNOSIS — R82998 Other abnormal findings in urine: Secondary | ICD-10-CM | POA: Diagnosis not present

## 2022-01-19 NOTE — Patient Outreach (Signed)
Received a Health Coach referral notification for [name]. ?I have assigned Melinda Loron, RN to call for follow up and determine if there are any Case Management needs.  ?  ?[name], CBCS, CMAA ?[place] Management Assistant ?[place] [place] Management ?[phone]    ?

## 2022-01-27 DIAGNOSIS — Z23 Encounter for immunization: Secondary | ICD-10-CM | POA: Diagnosis not present

## 2022-02-01 ENCOUNTER — Other Ambulatory Visit: Payer: Self-pay | Admitting: *Deleted

## 2022-02-01 NOTE — Patient Outreach (Signed)
[facility]) Care Management  02/01/2022  [name] 01/05/1931 [id number]  Unsuccessful outreach attempt made to patient. RN Health Coach left HIPAA compliant voicemail message along with her contact information.  Plan: RN Health Coach will call patient within the next several business days and will send an unsuccessful letter.   Melinda Loron RN, BSN [place] [phone] [name]

## 2022-02-09 ENCOUNTER — Other Ambulatory Visit: Payer: Self-pay | Admitting: *Deleted

## 2022-02-09 NOTE — Patient Outreach (Signed)
[facility]) Care Management  02/09/2022  [name] 1931-06-26 [id number]  Unsuccessful outreach attempt made to patient. Someone picked up the phone and said hello. This nurse asked if she could speak with the patient and the person stated that the patient was not available and hung up the phone. Nurse was not able to leave a VM.  Plan: RN Health Coach will call patient within the next several business days.  Emelia Loron RN, BSN [place] [phone] [name]

## 2022-02-15 ENCOUNTER — Other Ambulatory Visit: Payer: Self-pay | Admitting: *Deleted

## 2022-02-15 NOTE — Patient Outreach (Addendum)
[facility]) Care Management  02/15/2022  [name] 10-08-1930 [id number]  Successful telephone outreach call to patient for screening. HIPAA identifiers obtained. Nurse reviewed [facility] program and services with patient. Patient stated that she did not feel like the services would benefit her at this time. Adding that she lives at an assisted living facility which takes care of her health needs as well as she feels very well supported by her PCP's care team. Patient reports that she did receive the [facility] brochure in the mail.   Emelia Loron RN, BSN [name] [phone] [name]

## 2022-04-10 DIAGNOSIS — J449 Chronic obstructive pulmonary disease, unspecified: Secondary | ICD-10-CM | POA: Diagnosis not present

## 2022-04-10 DIAGNOSIS — M8589 Other specified disorders of bone density and structure, multiple sites: Secondary | ICD-10-CM | POA: Diagnosis not present

## 2022-04-10 DIAGNOSIS — R911 Solitary pulmonary nodule: Secondary | ICD-10-CM | POA: Diagnosis not present

## 2022-04-10 DIAGNOSIS — E785 Hyperlipidemia, unspecified: Secondary | ICD-10-CM | POA: Diagnosis not present

## 2022-04-10 DIAGNOSIS — I771 Stricture of artery: Secondary | ICD-10-CM | POA: Diagnosis not present

## 2022-04-10 DIAGNOSIS — I2721 Secondary pulmonary arterial hypertension: Secondary | ICD-10-CM | POA: Diagnosis not present

## 2022-04-10 DIAGNOSIS — I10 Essential (primary) hypertension: Secondary | ICD-10-CM | POA: Diagnosis not present

## 2022-04-10 DIAGNOSIS — D0512 Intraductal carcinoma in situ of left breast: Secondary | ICD-10-CM | POA: Diagnosis not present

## 2022-04-10 DIAGNOSIS — D692 Other nonthrombocytopenic purpura: Secondary | ICD-10-CM | POA: Diagnosis not present

## 2022-04-10 DIAGNOSIS — I351 Nonrheumatic aortic (valve) insufficiency: Secondary | ICD-10-CM | POA: Diagnosis not present

## 2022-04-10 DIAGNOSIS — R627 Adult failure to thrive: Secondary | ICD-10-CM | POA: Diagnosis not present

## 2022-04-10 DIAGNOSIS — I517 Cardiomegaly: Secondary | ICD-10-CM | POA: Diagnosis not present

## 2022-04-10 DIAGNOSIS — I7300 Raynaud's syndrome without gangrene: Secondary | ICD-10-CM | POA: Diagnosis not present

## 2022-04-18 ENCOUNTER — Encounter (INDEPENDENT_AMBULATORY_CARE_PROVIDER_SITE_OTHER): Payer: Self-pay | Admitting: Ophthalmology

## 2022-04-18 ENCOUNTER — Ambulatory Visit (INDEPENDENT_AMBULATORY_CARE_PROVIDER_SITE_OTHER): Payer: Medicare Other | Admitting: Ophthalmology

## 2022-04-18 DIAGNOSIS — H353132 Nonexudative age-related macular degeneration, bilateral, intermediate dry stage: Secondary | ICD-10-CM | POA: Diagnosis not present

## 2022-04-18 DIAGNOSIS — Z961 Presence of intraocular lens: Secondary | ICD-10-CM | POA: Diagnosis not present

## 2022-04-18 DIAGNOSIS — H353131 Nonexudative age-related macular degeneration, bilateral, early dry stage: Secondary | ICD-10-CM

## 2022-04-18 NOTE — Assessment & Plan Note (Signed)
OU, stable, no sign of CNVM with good acuity continue to monitor

## 2022-04-18 NOTE — Progress Notes (Signed)
04/18/2022     CHIEF COMPLAINT Patient presents for  Chief Complaint  Patient presents with   Macular Degeneration      HISTORY OF PRESENT ILLNESS: [name] is a 88 y.o. female who presents to the clinic today for:   HPI   1 YR FU OU OCT FP. Pt stated vision has remained stable within the past year. Pt denies new floaters and FOL.  Last edited by Silvestre Moment on 04/18/2022 12:45 PM.      Referring physician: Shon Baton, MD [address] [place],  [place] [address]  HISTORICAL INFORMATION:   Selected notes from the MEDICAL RECORD NUMBER    Lab Results  Component Value Date   HGBA1C 5.1 11/11/2017     CURRENT MEDICATIONS: Current Outpatient Medications (Ophthalmic Drugs)  Medication Sig   Carboxymethylcellulose Sodium (REFRESH PLUS OP) Place 1 drop into both eyes 4 (four) times daily. My use additional drops as needed for dry eyes   DORZOLAMIDE HCL OP Place 1 drop into the left eye 2 (two) times daily.   Polyethyl Glycol-Propyl Glycol (SYSTANE OP) Place 1 drop into both eyes 2 (two) times daily.    sodium chloride (MURO 128) 5 % ophthalmic solution Place 1 drop into both eyes 2 (two) times daily.   No current facility-administered medications for this visit. (Ophthalmic Drugs)   Current Outpatient Medications (Other)  Medication Sig   alendronate (FOSAMAX) 70 MG tablet Take 70 mg by mouth every 14 (fourteen) days. Take with a full glass of water on an empty stomach.   ALPRAZolam (XANAX) 0.25 MG tablet    amLODipine (NORVASC) 2.5 MG tablet Take 2.5 mg by mouth daily.   Ascorbic Acid (VITAMIN C) 1000 MG tablet Take 1,000 mg by mouth daily.   aspirin 81 MG EC tablet Take 1 tablet (81 mg total) by mouth daily.   atenolol (TENORMIN) 50 MG tablet Take 50 mg by mouth daily.   atorvastatin (LIPITOR) 80 MG tablet Take 1 tablet (80 mg total) by mouth daily at 6 PM.   calcium carbonate (OS-CAL - DOSED IN MG OF ELEMENTAL CALCIUM) 1250 MG tablet Take 1 tablet by mouth  2 (two) times daily.     cholecalciferol (VITAMIN D) 400 units TABS tablet Take 400 Units by mouth 2 (two) times daily.   furosemide (LASIX) 20 MG tablet Take 20 mg by mouth daily.   GLUCOSAMINE-CHONDROITIN PO Take 1,500 mg by mouth.   HYDROcodone-acetaminophen (NORCO) 10-325 MG tablet Take 1 tablet by mouth every 4 (four) hours as needed. for pain   Lutein 6 MG TABS Take 6 mg by mouth daily.   Multiple Vitamin (MULTIVITAMIN) tablet Take 1 tablet by mouth daily.   Omega-3 1000 MG CAPS Take by mouth 2 (two) times daily.    ondansetron (ZOFRAN-ODT) 4 MG disintegrating tablet Take 4 mg by mouth every 8 (eight) hours as needed. for nausea   Turmeric Curcumin 500 MG CAPS Take 500 mg by mouth daily.   vitamin B-12 (CYANOCOBALAMIN) 100 MCG tablet Take 100 mcg by mouth daily.    No current facility-administered medications for this visit. (Other)      REVIEW OF SYSTEMS: ROS   Negative for: Constitutional, Gastrointestinal, Neurological, Skin, Genitourinary, Musculoskeletal, HENT, Endocrine, Cardiovascular, Eyes, Respiratory, Psychiatric, Allergic/Imm, Heme/Lymph Last edited by Silvestre Moment on 04/18/2022 12:45 PM.       ALLERGIES Allergies  Allergen Reactions   Lisinopril Cough   Oxycodone Other (See Comments)    PAST MEDICAL HISTORY Past  Medical History:  Diagnosis Date   Arthritis    "all the joints I've had replaced; some in my hands probably" (11/19/2017)   Breast cancer, left breast ([place]) 2000   Carotid stenosis, right    High cholesterol    History of blood transfusion    "w/both knee surgeries" (11/19/2017)   Hypertension    Pneumonia    "had it as a child"   Stroke [facility])    TIA (transient ischemic attack) 11/10/2017   Past Surgical History:  Procedure Laterality Date   ANKLE SURGERY Left 03/17/2012   "replacement; done at [facility]"   Tome Left    "multiple"   CAROTID ENDARTERECTOMY Right 11/19/2017   CATARACT EXTRACTION W/ INTRAOCULAR LENS   IMPLANT, BILATERAL Bilateral 08/2005-09/2005   right-left   DILATION AND CURETTAGE OF UTERUS     ENDARTERECTOMY Right 11/19/2017   Procedure: ENDARTERECTOMY CAROTID RIGHT;  Surgeon: Conrad Thomson, MD;  Location: [facility];  Service: Vascular;  Laterality: Right;   EYE SURGERY Right 11/15/2009   "puckered retina"   EYE SURGERY Left 07/11/12   "slipped lens implant due to exfollation"   GANGLION CYST EXCISION Left 07/03/2014   JOINT REPLACEMENT     left shoulder replacement     left shoulder replacement     MASTECTOMY, PARTIAL Left 05/1999   repair of ankle replacment and shin remove cysts     right shoulder replacement     THUMB FUSION Right 1997   TOENAIL EXCISION Right    1992   TONSILLECTOMY  1940   TOTAL KNEE ARTHROPLASTY Left 02/2010   TOTAL KNEE ARTHROPLASTY Right 07/19/2009   TOTAL SHOULDER REPLACEMENT Bilateral 01/2007-02/2008   right-left    FAMILY HISTORY Family History  Problem Relation Age of Onset   Cancer Sister    Heart disease Sister    Breast cancer Sister        ? age of onset   Stroke Sister     SOCIAL HISTORY Social History   Tobacco Use   Smoking status: Former    Packs/day: 1.00    Years: 25.00    Total pack years: 25.00    Types: Cigarettes    Quit date: 07/20/1979    Years since quitting: 42.7   Smokeless tobacco: Never  Vaping Use   Vaping Use: Never used  Substance Use Topics   Alcohol use: Yes    Alcohol/week: 1.0 standard drink of alcohol    Types: 1 Glasses of wine per week    Comment: yearly   Drug use: No         OPHTHALMIC EXAM:  Base Eye Exam     Visual Acuity (ETDRS)       Right Left   Dist cc 20/25 -1 +1 20/20 -1    Correction: Glasses         Tonometry (Tonopen, 12:50 PM)       Right Left   Pressure 8 13         Pupils       Pupils Dark Light Shape React APD   Right PERRL 4 3 Round Slow None   Left PERRL 4 3 Round Slow None         Visual Fields       Left Right    Full Full          Extraocular Movement       Right Left    Full, Ortho Full, Ortho  Neuro/Psych     Oriented x3: Yes   Mood/Affect: Normal         Dilation     Both eyes: 1.0% Mydriacyl, 2.5% Phenylephrine @ 12:50 PM           Slit Lamp and Fundus Exam     External Exam       Right Left   External Normal Normal         Slit Lamp Exam       Right Left   Lids/Lashes Normal Normal   Conjunctiva/Sclera White and quiet White and quiet   Cornea Clear Clear   Anterior Chamber Deep and quiet Deep and quiet   Iris Round and reactive horizontal oval and reactive, patent pi superiorly   Lens Posterior chamber intraocular lens Anterior chamber intraocular lens   Anterior Vitreous Normal Normal         Fundus Exam       Right Left   Posterior Vitreous Posterior vitreous detachment Posterior vitreous detachment   Disc 1+ Pallor 1+ Pallor   C/D Ratio 0.45 0.35   Macula Retinal pigment epithelial mottling, Early age related macular degeneration Retinal pigment epithelial mottling, Early age related macular degeneration   Vessels Normal Normal   Periphery Normal Normal            IMAGING AND PROCEDURES  Imaging and Procedures for 04/18/22  OCT, Retina - OU - Both Eyes       Right Eye Quality was good. Scan locations included subfoveal. Central Foveal Thickness: 349. Progression has been stable. Findings include no SRF, retinal drusen .   Left Eye Quality was good. Scan locations included subfoveal. Central Foveal Thickness: 270. Findings include no SRF, retinal drusen .      Color Fundus Photography Optos - OU - Both Eyes       Right Eye Progression has been stable. Disc findings include increased cup to disc ratio, pallor. Macula : drusen. Vessels : normal observations. Periphery : normal observations.   Left Eye Progression has been stable. Disc findings include normal observations. Macula : drusen. Vessels : normal observations. Periphery : normal  observations.              ASSESSMENT/PLAN:  Early stage nonexudative age-related macular degeneration of both eyes OU, stable, no sign of CNVM with good acuity continue to monitor  Pseudophakia, both eyes Stable OU     ICD-10-CM   1. 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 Early stage nonexudative age-related macular degeneration of both eyes  H35.3131     3. Pseudophakia, both eyes  Z96.1       1.  OU, minor early ARMD.  Optional use of oral vitamins, AREDS 2 formulation  2.  Patient to report any new onset visual acuity declines or distortions  3.  Ophthalmic Meds Ordered this visit:  No orders of the defined types were placed in this encounter.      Return in about 1 year (around 04/19/2023) for DILATE OU, COLOR FP, OCT.  There are no Patient Instructions on file for this visit.   Explained the diagnoses, plan, and follow up with the patient and they expressed understanding.  Patient expressed understanding of the importance of proper follow up care.   Clent Demark Mayan Dolney M.D. Diseases & Surgery of the Retina and Vitreous  [facility] [place] 04/18/22     Abbreviations: M myopia (nearsighted); A astigmatism; H hyperopia (farsighted); P presbyopia; Mrx spectacle prescription;  CTL contact lenses; OD right eye; OS left eye; OU both eyes  XT exotropia; ET esotropia; PEK punctate epithelial keratitis; PEE punctate epithelial erosions; DES dry eye syndrome; MGD meibomian gland dysfunction; ATs artificial tears; PFAT's preservative free artificial tears; [place] nuclear sclerotic cataract; PSC posterior subcapsular cataract; ERM epi-retinal membrane; PVD posterior vitreous detachment; RD retinal detachment; DM diabetes mellitus; DR diabetic retinopathy; NPDR non-proliferative diabetic retinopathy; PDR proliferative diabetic  retinopathy; CSME clinically significant macular edema; DME diabetic macular edema; dbh dot blot hemorrhages; CWS cotton wool spot; POAG primary open angle glaucoma; C/D cup-to-disc ratio; HVF humphrey visual field; GVF goldmann visual field; OCT optical coherence tomography; IOP intraocular pressure; BRVO Branch retinal vein occlusion; CRVO central retinal vein occlusion; CRAO central retinal artery occlusion; BRAO branch retinal artery occlusion; RT retinal tear; SB scleral buckle; PPV pars plana vitrectomy; VH Vitreous hemorrhage; PRP panretinal laser photocoagulation; IVK intravitreal kenalog; VMT vitreomacular traction; MH Macular hole;  NVD neovascularization of the disc; NVE neovascularization elsewhere; AREDS age related eye disease study; ARMD age related macular degeneration; POAG primary open angle glaucoma; EBMD epithelial/anterior basement membrane dystrophy; ACIOL anterior chamber intraocular lens; IOL intraocular lens; PCIOL posterior chamber intraocular lens; Phaco/IOL phacoemulsification with intraocular lens placement; [place] photorefractive keratectomy; LASIK laser assisted in situ keratomileusis; HTN hypertension; DM diabetes mellitus; COPD chronic obstructive pulmonary disease

## 2022-04-18 NOTE — Assessment & Plan Note (Signed)
Stable OU 

## 2022-05-03 DIAGNOSIS — H353131 Nonexudative age-related macular degeneration, bilateral, early dry stage: Secondary | ICD-10-CM | POA: Diagnosis not present

## 2022-05-03 DIAGNOSIS — H40013 Open angle with borderline findings, low risk, bilateral: Secondary | ICD-10-CM | POA: Diagnosis not present

## 2022-05-03 DIAGNOSIS — H35373 Puckering of macula, bilateral: Secondary | ICD-10-CM | POA: Diagnosis not present

## 2022-05-03 DIAGNOSIS — H18593 Other hereditary corneal dystrophies, bilateral: Secondary | ICD-10-CM | POA: Diagnosis not present

## 2022-05-03 DIAGNOSIS — Z961 Presence of intraocular lens: Secondary | ICD-10-CM | POA: Diagnosis not present

## 2022-05-03 DIAGNOSIS — H2589 Other age-related cataract: Secondary | ICD-10-CM | POA: Diagnosis not present

## 2022-06-27 DIAGNOSIS — Z23 Encounter for immunization: Secondary | ICD-10-CM | POA: Diagnosis not present

## 2022-07-13 DIAGNOSIS — Z23 Encounter for immunization: Secondary | ICD-10-CM | POA: Diagnosis not present

## 2022-10-05 DIAGNOSIS — E559 Vitamin D deficiency, unspecified: Secondary | ICD-10-CM | POA: Diagnosis not present

## 2022-10-05 DIAGNOSIS — I10 Essential (primary) hypertension: Secondary | ICD-10-CM | POA: Diagnosis not present

## 2022-10-05 DIAGNOSIS — R7989 Other specified abnormal findings of blood chemistry: Secondary | ICD-10-CM | POA: Diagnosis not present

## 2022-10-05 DIAGNOSIS — E785 Hyperlipidemia, unspecified: Secondary | ICD-10-CM | POA: Diagnosis not present

## 2022-10-05 DIAGNOSIS — R5383 Other fatigue: Secondary | ICD-10-CM | POA: Diagnosis not present

## 2022-10-12 DIAGNOSIS — R82998 Other abnormal findings in urine: Secondary | ICD-10-CM | POA: Diagnosis not present

## 2022-10-12 DIAGNOSIS — I10 Essential (primary) hypertension: Secondary | ICD-10-CM | POA: Diagnosis not present

## 2022-11-28 DIAGNOSIS — R197 Diarrhea, unspecified: Secondary | ICD-10-CM | POA: Diagnosis not present

## 2022-12-08 DIAGNOSIS — H35363 Drusen (degenerative) of macula, bilateral: Secondary | ICD-10-CM | POA: Diagnosis not present

## 2022-12-08 DIAGNOSIS — H35372 Puckering of macula, left eye: Secondary | ICD-10-CM | POA: Diagnosis not present

## 2022-12-08 DIAGNOSIS — H35342 Macular cyst, hole, or pseudohole, left eye: Secondary | ICD-10-CM | POA: Diagnosis not present

## 2022-12-08 DIAGNOSIS — H53123 Transient visual loss, bilateral: Secondary | ICD-10-CM | POA: Diagnosis not present

## 2022-12-08 DIAGNOSIS — H353112 Nonexudative age-related macular degeneration, right eye, intermediate dry stage: Secondary | ICD-10-CM | POA: Diagnosis not present

## 2022-12-08 DIAGNOSIS — H353121 Nonexudative age-related macular degeneration, left eye, early dry stage: Secondary | ICD-10-CM | POA: Diagnosis not present

## 2022-12-08 DIAGNOSIS — Z961 Presence of intraocular lens: Secondary | ICD-10-CM | POA: Diagnosis not present

## 2022-12-25 DIAGNOSIS — H2589 Other age-related cataract: Secondary | ICD-10-CM | POA: Diagnosis not present

## 2022-12-25 DIAGNOSIS — H18593 Other hereditary corneal dystrophies, bilateral: Secondary | ICD-10-CM | POA: Diagnosis not present

## 2022-12-25 DIAGNOSIS — H35373 Puckering of macula, bilateral: Secondary | ICD-10-CM | POA: Diagnosis not present

## 2022-12-25 DIAGNOSIS — H40013 Open angle with borderline findings, low risk, bilateral: Secondary | ICD-10-CM | POA: Diagnosis not present

## 2022-12-25 DIAGNOSIS — H353131 Nonexudative age-related macular degeneration, bilateral, early dry stage: Secondary | ICD-10-CM | POA: Diagnosis not present

## 2022-12-25 DIAGNOSIS — Z961 Presence of intraocular lens: Secondary | ICD-10-CM | POA: Diagnosis not present

## 2023-01-21 IMAGING — MG MM DIGITAL SCREENING BILAT W/ TOMO AND CAD
8 series · 9 of 24 positions shown · non-contrast
Comparison: Previous exam(s).

CLINICAL DATA: Screening.

EXAM:
DIGITAL SCREENING BILATERAL MAMMOGRAM WITH TOMOSYNTHESIS AND CAD
TECHNIQUE: Bilateral screening digital craniocaudal and mediolateral oblique
mammograms were obtained. Bilateral screening digital breast
tomosynthesis was performed. The images were evaluated with
computer-aided detection.

[R CC synth-2D]
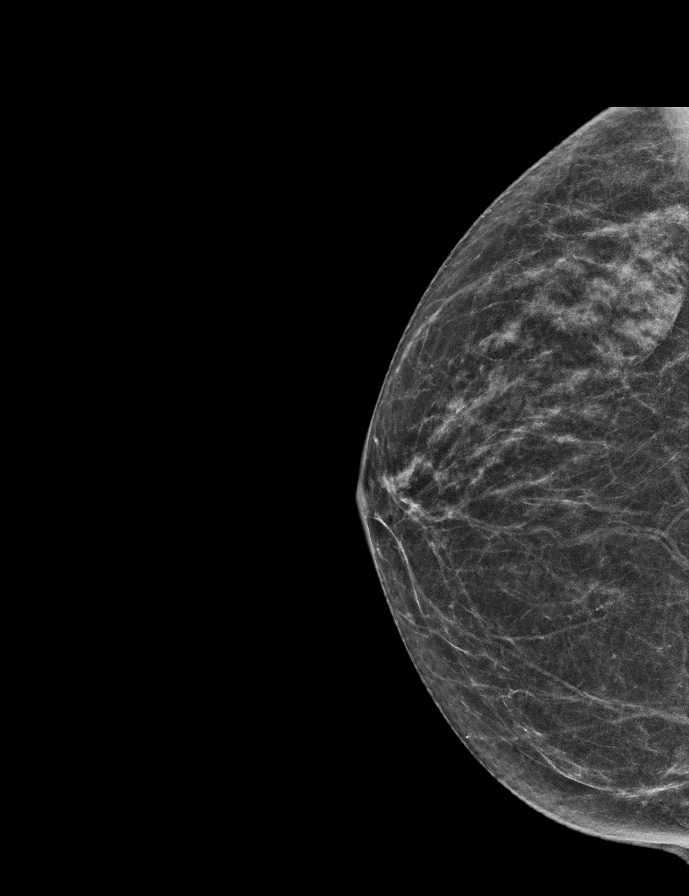

[L CC synth-2D]
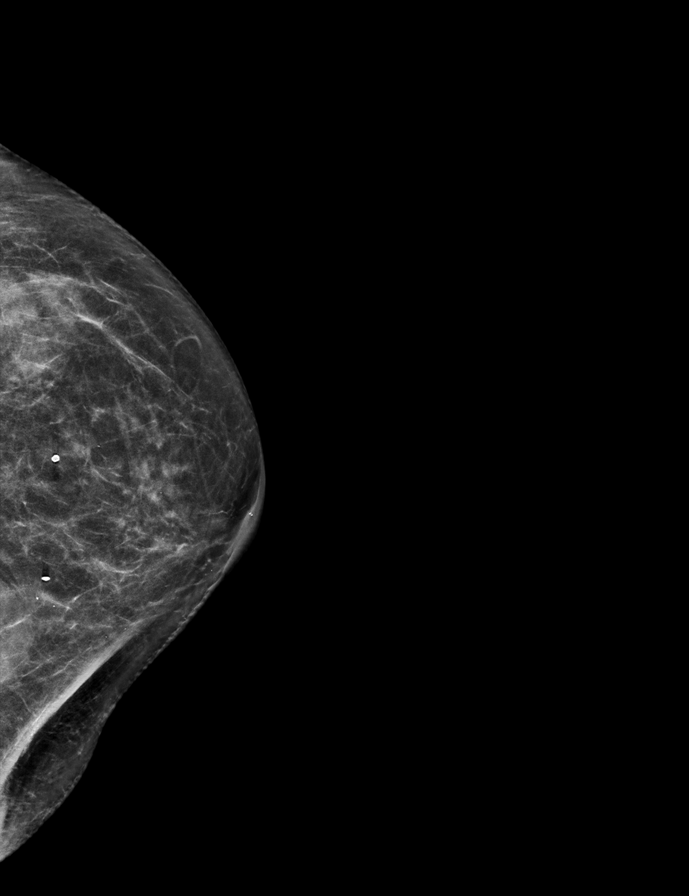

[R MLO synth-2D]
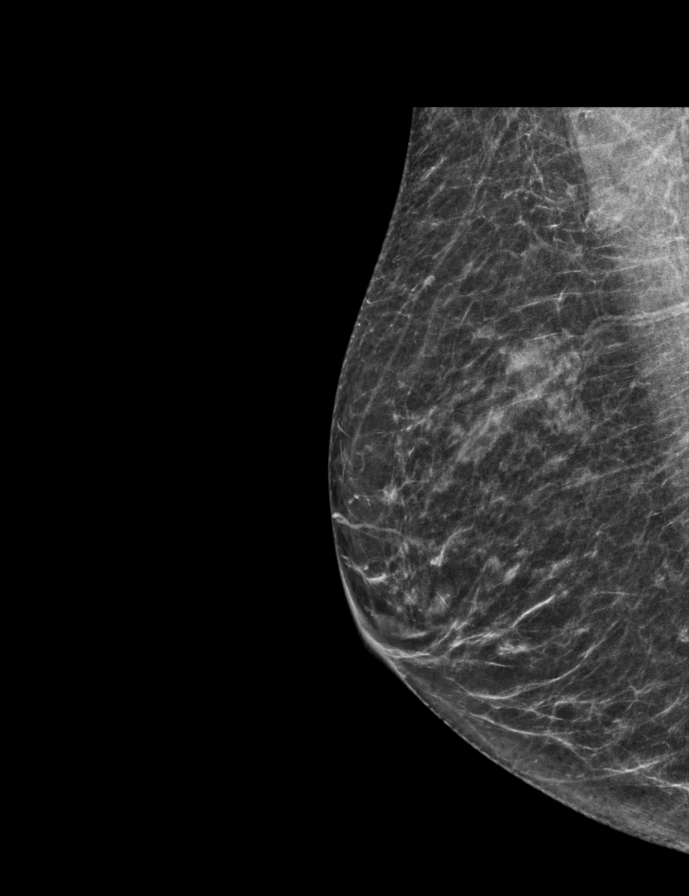

[L MLO synth-2D]
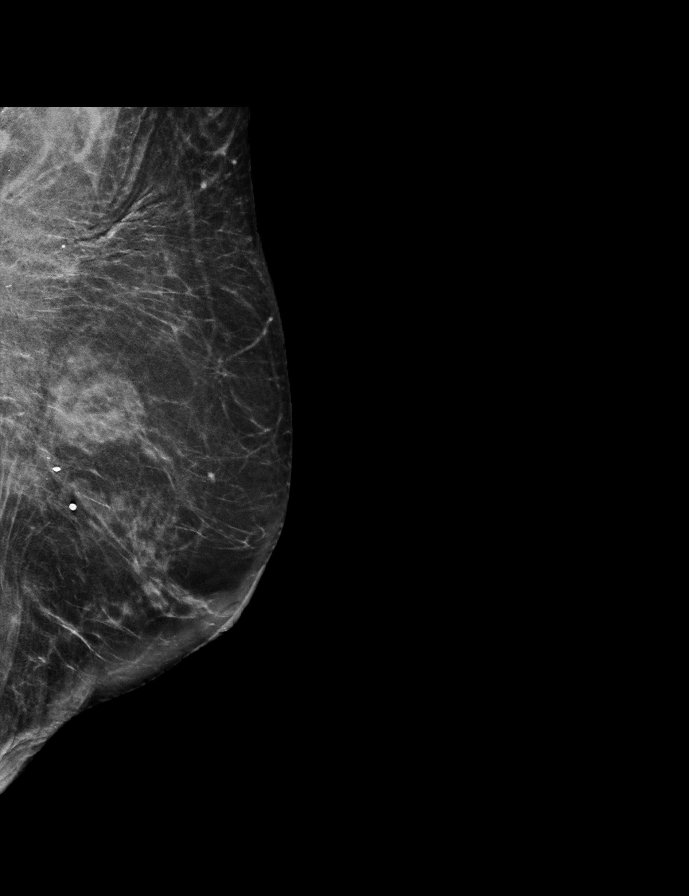

[R CC tomo · 2 of 54 frames shown]
[frame 18/54]
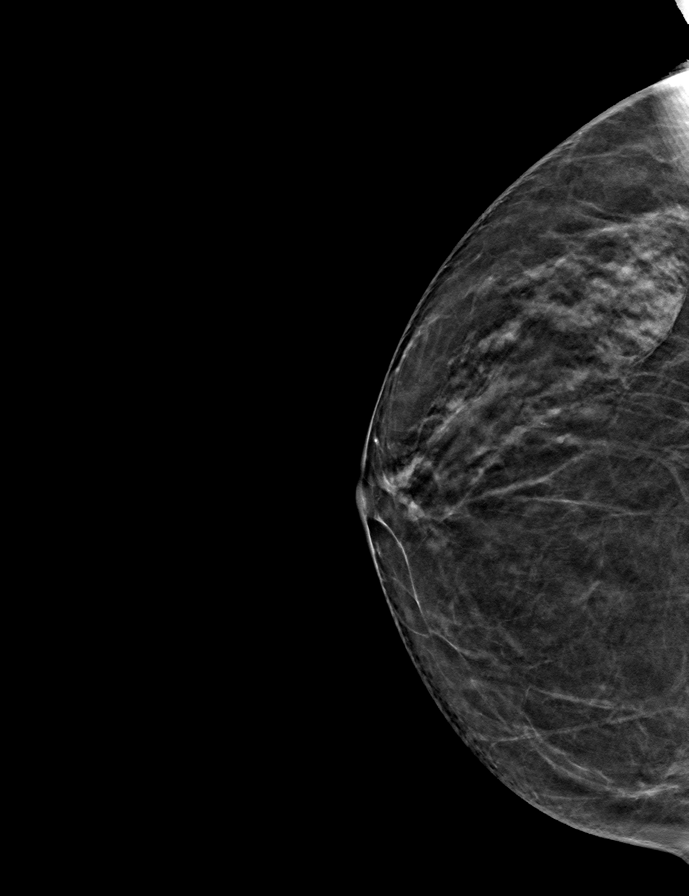
[frame 27/54]
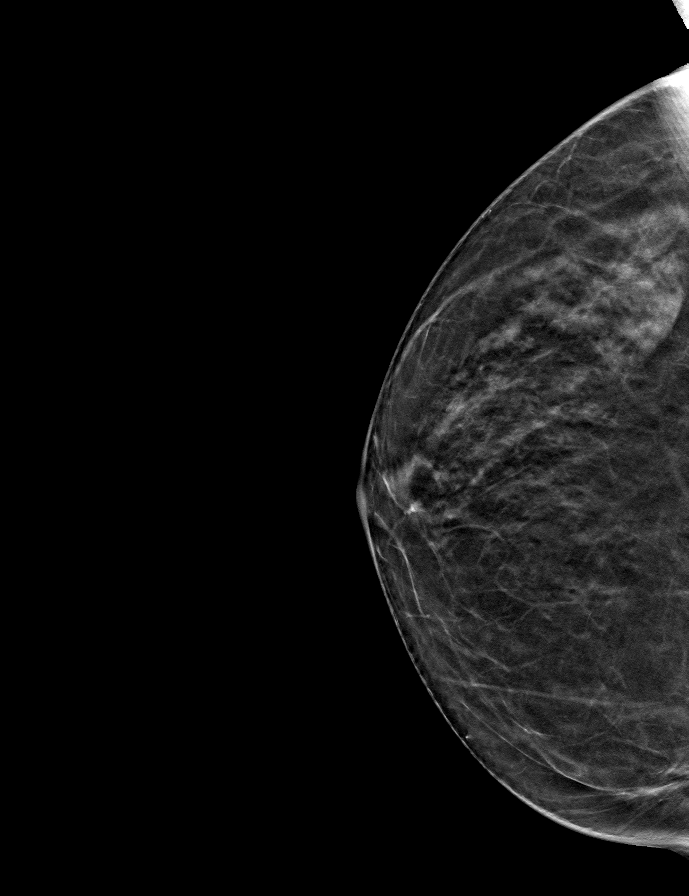

[L CC tomo · tomo slice 31/60.0]
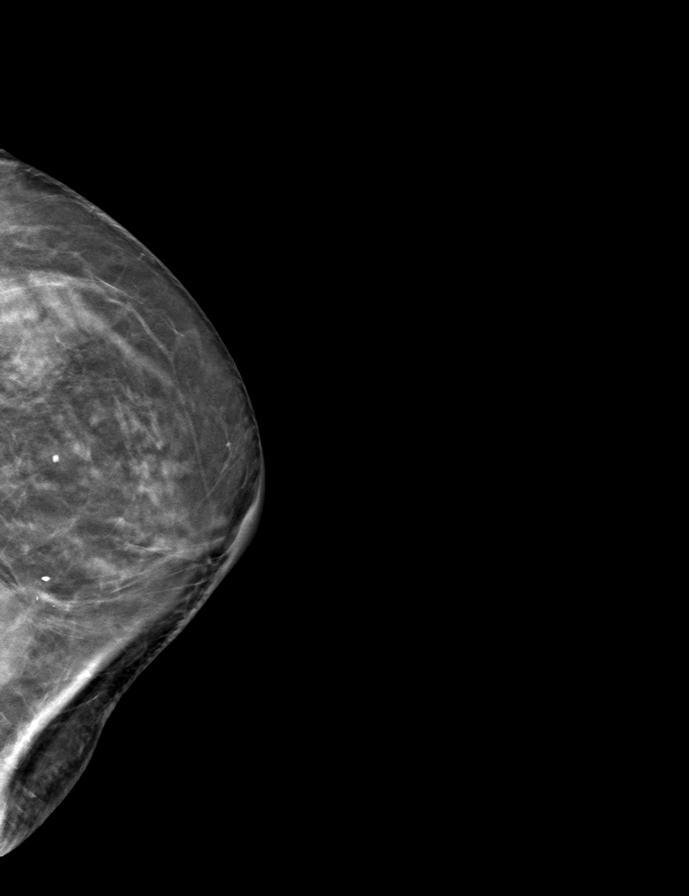

[L MLO tomo · tomo slice 32/63.0]
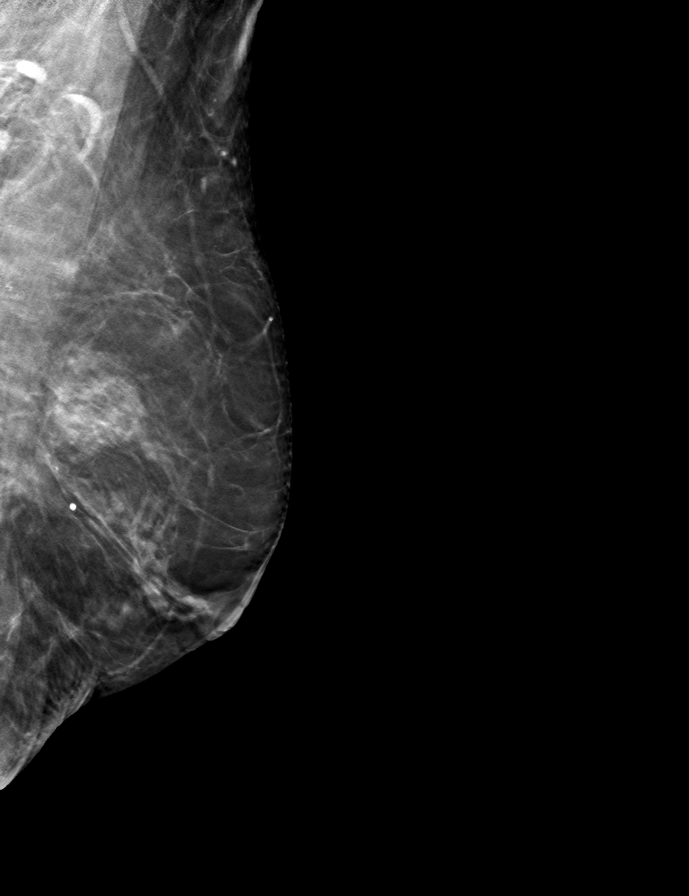

[R MLO tomo · tomo slice 28/55.0]
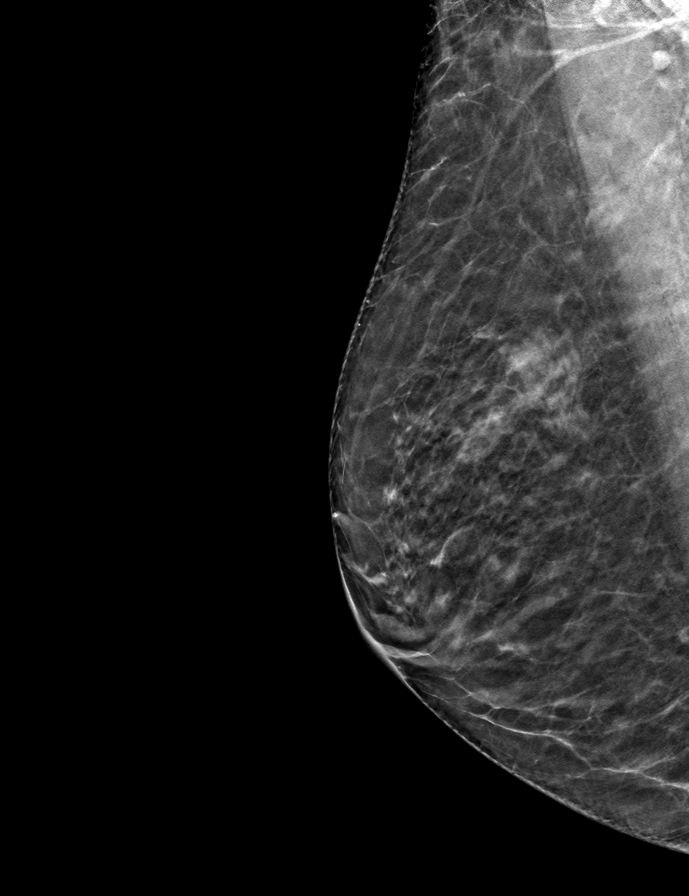

[9 of 24 positions shown; findings below may reference images not displayed]

ACR Breast Density Category b: There are scattered areas of
fibroglandular density.
FINDINGS: There are no findings suspicious for malignancy.
IMPRESSION: No mammographic evidence of malignancy. A result letter of this
screening mammogram will be mailed directly to the patient.

RECOMMENDATION:
Screening mammogram in one year. (Code:51-O-LD2)

BI-RADS CATEGORY  1: Negative.

## 2023-02-20 DIAGNOSIS — R627 Adult failure to thrive: Secondary | ICD-10-CM | POA: Diagnosis not present

## 2023-02-20 DIAGNOSIS — R2689 Other abnormalities of gait and mobility: Secondary | ICD-10-CM | POA: Diagnosis not present

## 2023-02-20 DIAGNOSIS — I10 Essential (primary) hypertension: Secondary | ICD-10-CM | POA: Diagnosis not present

## 2023-02-20 DIAGNOSIS — R269 Unspecified abnormalities of gait and mobility: Secondary | ICD-10-CM | POA: Diagnosis not present

## 2023-02-20 DIAGNOSIS — R296 Repeated falls: Secondary | ICD-10-CM | POA: Diagnosis not present

## 2023-04-23 ENCOUNTER — Encounter (INDEPENDENT_AMBULATORY_CARE_PROVIDER_SITE_OTHER): Payer: Medicare Other | Admitting: Ophthalmology

## 2023-04-23 ENCOUNTER — Encounter (INDEPENDENT_AMBULATORY_CARE_PROVIDER_SITE_OTHER): Payer: Self-pay

## 2023-04-23 DIAGNOSIS — H353131 Nonexudative age-related macular degeneration, bilateral, early dry stage: Secondary | ICD-10-CM | POA: Diagnosis not present

## 2023-04-23 DIAGNOSIS — H35372 Puckering of macula, left eye: Secondary | ICD-10-CM | POA: Diagnosis not present

## 2023-06-27 DIAGNOSIS — Z23 Encounter for immunization: Secondary | ICD-10-CM | POA: Diagnosis not present

## 2023-07-11 DIAGNOSIS — Z23 Encounter for immunization: Secondary | ICD-10-CM | POA: Diagnosis not present

## 2023-07-17 DIAGNOSIS — H35373 Puckering of macula, bilateral: Secondary | ICD-10-CM | POA: Diagnosis not present

## 2023-07-17 DIAGNOSIS — H353131 Nonexudative age-related macular degeneration, bilateral, early dry stage: Secondary | ICD-10-CM | POA: Diagnosis not present

## 2023-07-17 DIAGNOSIS — H2589 Other age-related cataract: Secondary | ICD-10-CM | POA: Diagnosis not present

## 2023-07-17 DIAGNOSIS — H40013 Open angle with borderline findings, low risk, bilateral: Secondary | ICD-10-CM | POA: Diagnosis not present

## 2023-07-17 DIAGNOSIS — Z961 Presence of intraocular lens: Secondary | ICD-10-CM | POA: Diagnosis not present

## 2023-07-17 DIAGNOSIS — H18593 Other hereditary corneal dystrophies, bilateral: Secondary | ICD-10-CM | POA: Diagnosis not present

## 2023-09-17 DIAGNOSIS — M25511 Pain in right shoulder: Secondary | ICD-10-CM | POA: Diagnosis not present

## 2023-10-08 DIAGNOSIS — I10 Essential (primary) hypertension: Secondary | ICD-10-CM | POA: Diagnosis not present

## 2023-10-08 DIAGNOSIS — E559 Vitamin D deficiency, unspecified: Secondary | ICD-10-CM | POA: Diagnosis not present

## 2023-10-08 DIAGNOSIS — M8580 Other specified disorders of bone density and structure, unspecified site: Secondary | ICD-10-CM | POA: Diagnosis not present

## 2023-10-08 DIAGNOSIS — E785 Hyperlipidemia, unspecified: Secondary | ICD-10-CM | POA: Diagnosis not present

## 2023-10-17 DIAGNOSIS — H35373 Puckering of macula, bilateral: Secondary | ICD-10-CM | POA: Diagnosis not present

## 2023-10-17 DIAGNOSIS — H40013 Open angle with borderline findings, low risk, bilateral: Secondary | ICD-10-CM | POA: Diagnosis not present

## 2023-10-17 DIAGNOSIS — Z1389 Encounter for screening for other disorder: Secondary | ICD-10-CM | POA: Diagnosis not present

## 2023-10-17 DIAGNOSIS — H2589 Other age-related cataract: Secondary | ICD-10-CM | POA: Diagnosis not present

## 2023-10-17 DIAGNOSIS — Z23 Encounter for immunization: Secondary | ICD-10-CM | POA: Diagnosis not present

## 2023-10-17 DIAGNOSIS — Z1331 Encounter for screening for depression: Secondary | ICD-10-CM | POA: Diagnosis not present

## 2023-10-17 DIAGNOSIS — R82998 Other abnormal findings in urine: Secondary | ICD-10-CM | POA: Diagnosis not present

## 2023-10-17 DIAGNOSIS — Z961 Presence of intraocular lens: Secondary | ICD-10-CM | POA: Diagnosis not present

## 2023-10-17 DIAGNOSIS — H16223 Keratoconjunctivitis sicca, not specified as Sjogren's, bilateral: Secondary | ICD-10-CM | POA: Diagnosis not present

## 2023-10-17 DIAGNOSIS — H353131 Nonexudative age-related macular degeneration, bilateral, early dry stage: Secondary | ICD-10-CM | POA: Diagnosis not present

## 2023-10-18 DIAGNOSIS — M25611 Stiffness of right shoulder, not elsewhere classified: Secondary | ICD-10-CM | POA: Diagnosis not present

## 2023-10-18 DIAGNOSIS — Z96611 Presence of right artificial shoulder joint: Secondary | ICD-10-CM | POA: Diagnosis not present

## 2023-11-01 DIAGNOSIS — R278 Other lack of coordination: Secondary | ICD-10-CM | POA: Diagnosis not present

## 2023-11-06 DIAGNOSIS — R278 Other lack of coordination: Secondary | ICD-10-CM | POA: Diagnosis not present

## 2023-11-08 DIAGNOSIS — R278 Other lack of coordination: Secondary | ICD-10-CM | POA: Diagnosis not present

## 2023-11-15 DIAGNOSIS — R278 Other lack of coordination: Secondary | ICD-10-CM | POA: Diagnosis not present

## 2023-11-20 DIAGNOSIS — R278 Other lack of coordination: Secondary | ICD-10-CM | POA: Diagnosis not present

## 2023-11-22 DIAGNOSIS — R278 Other lack of coordination: Secondary | ICD-10-CM | POA: Diagnosis not present

## 2023-11-27 DIAGNOSIS — R278 Other lack of coordination: Secondary | ICD-10-CM | POA: Diagnosis not present

## 2023-12-04 DIAGNOSIS — R278 Other lack of coordination: Secondary | ICD-10-CM | POA: Diagnosis not present

## 2023-12-06 DIAGNOSIS — R278 Other lack of coordination: Secondary | ICD-10-CM | POA: Diagnosis not present

## 2023-12-11 DIAGNOSIS — R278 Other lack of coordination: Secondary | ICD-10-CM | POA: Diagnosis not present

## 2023-12-17 DIAGNOSIS — H353131 Nonexudative age-related macular degeneration, bilateral, early dry stage: Secondary | ICD-10-CM | POA: Diagnosis not present

## 2023-12-17 DIAGNOSIS — H2589 Other age-related cataract: Secondary | ICD-10-CM | POA: Diagnosis not present

## 2023-12-17 DIAGNOSIS — H40013 Open angle with borderline findings, low risk, bilateral: Secondary | ICD-10-CM | POA: Diagnosis not present

## 2023-12-17 DIAGNOSIS — H35373 Puckering of macula, bilateral: Secondary | ICD-10-CM | POA: Diagnosis not present

## 2023-12-17 DIAGNOSIS — H18593 Other hereditary corneal dystrophies, bilateral: Secondary | ICD-10-CM | POA: Diagnosis not present

## 2023-12-18 DIAGNOSIS — R278 Other lack of coordination: Secondary | ICD-10-CM | POA: Diagnosis not present

## 2023-12-20 DIAGNOSIS — R278 Other lack of coordination: Secondary | ICD-10-CM | POA: Diagnosis not present

## 2023-12-25 DIAGNOSIS — R278 Other lack of coordination: Secondary | ICD-10-CM | POA: Diagnosis not present

## 2024-01-01 DIAGNOSIS — G8929 Other chronic pain: Secondary | ICD-10-CM | POA: Diagnosis not present

## 2024-01-01 DIAGNOSIS — M25511 Pain in right shoulder: Secondary | ICD-10-CM | POA: Diagnosis not present

## 2024-01-01 DIAGNOSIS — R278 Other lack of coordination: Secondary | ICD-10-CM | POA: Diagnosis not present

## 2024-01-01 DIAGNOSIS — Z96611 Presence of right artificial shoulder joint: Secondary | ICD-10-CM | POA: Diagnosis not present

## 2024-01-15 DIAGNOSIS — R278 Other lack of coordination: Secondary | ICD-10-CM | POA: Diagnosis not present

## 2024-01-17 DIAGNOSIS — R278 Other lack of coordination: Secondary | ICD-10-CM | POA: Diagnosis not present

## 2024-02-29 DIAGNOSIS — M25531 Pain in right wrist: Secondary | ICD-10-CM | POA: Diagnosis not present

## 2024-02-29 DIAGNOSIS — M8580 Other specified disorders of bone density and structure, unspecified site: Secondary | ICD-10-CM | POA: Diagnosis not present

## 2024-02-29 DIAGNOSIS — R0781 Pleurodynia: Secondary | ICD-10-CM | POA: Diagnosis not present

## 2024-02-29 DIAGNOSIS — W19XXXA Unspecified fall, initial encounter: Secondary | ICD-10-CM | POA: Diagnosis not present

## 2024-02-29 DIAGNOSIS — I10 Essential (primary) hypertension: Secondary | ICD-10-CM | POA: Diagnosis not present

## 2024-02-29 DIAGNOSIS — R269 Unspecified abnormalities of gait and mobility: Secondary | ICD-10-CM | POA: Diagnosis not present

## 2024-02-29 DIAGNOSIS — R58 Hemorrhage, not elsewhere classified: Secondary | ICD-10-CM | POA: Diagnosis not present

## 2024-04-14 DIAGNOSIS — E785 Hyperlipidemia, unspecified: Secondary | ICD-10-CM | POA: Diagnosis not present

## 2024-04-14 DIAGNOSIS — I119 Hypertensive heart disease without heart failure: Secondary | ICD-10-CM | POA: Diagnosis not present

## 2024-04-21 DIAGNOSIS — H18593 Other hereditary corneal dystrophies, bilateral: Secondary | ICD-10-CM | POA: Diagnosis not present

## 2024-04-21 DIAGNOSIS — H2589 Other age-related cataract: Secondary | ICD-10-CM | POA: Diagnosis not present

## 2024-04-21 DIAGNOSIS — Z961 Presence of intraocular lens: Secondary | ICD-10-CM | POA: Diagnosis not present

## 2024-04-21 DIAGNOSIS — H40013 Open angle with borderline findings, low risk, bilateral: Secondary | ICD-10-CM | POA: Diagnosis not present

## 2024-06-09 DIAGNOSIS — H353132 Nonexudative age-related macular degeneration, bilateral, intermediate dry stage: Secondary | ICD-10-CM | POA: Diagnosis not present

## 2024-06-09 DIAGNOSIS — H35372 Puckering of macula, left eye: Secondary | ICD-10-CM | POA: Diagnosis not present

## 2024-08-20 DIAGNOSIS — Z961 Presence of intraocular lens: Secondary | ICD-10-CM | POA: Diagnosis not present

## 2024-08-20 DIAGNOSIS — H18593 Other hereditary corneal dystrophies, bilateral: Secondary | ICD-10-CM | POA: Diagnosis not present

## 2024-08-20 DIAGNOSIS — H2589 Other age-related cataract: Secondary | ICD-10-CM | POA: Diagnosis not present

## 2024-08-20 DIAGNOSIS — H40013 Open angle with borderline findings, low risk, bilateral: Secondary | ICD-10-CM | POA: Diagnosis not present
